# Patient Record
Sex: Female | Born: 2000
Health system: Southern US, Community
[De-identification: ages and names within clinical notes are randomized; demographics above are authoritative.]

## PROBLEM LIST (undated history)

## (undated) DIAGNOSIS — H539 Unspecified visual disturbance: Secondary | ICD-10-CM

## (undated) DIAGNOSIS — J45909 Unspecified asthma, uncomplicated: Secondary | ICD-10-CM

## (undated) DIAGNOSIS — F32A Depression, unspecified: Secondary | ICD-10-CM

## (undated) DIAGNOSIS — D649 Anemia, unspecified: Secondary | ICD-10-CM

## (undated) DIAGNOSIS — F909 Attention-deficit hyperactivity disorder, unspecified type: Secondary | ICD-10-CM

## (undated) DIAGNOSIS — T8859XA Other complications of anesthesia, initial encounter: Secondary | ICD-10-CM

## (undated) DIAGNOSIS — Z8489 Family history of other specified conditions: Secondary | ICD-10-CM

## (undated) DIAGNOSIS — T8069XA Other serum reaction due to other serum, initial encounter: Secondary | ICD-10-CM

## (undated) DIAGNOSIS — F419 Anxiety disorder, unspecified: Secondary | ICD-10-CM

## (undated) DIAGNOSIS — Z87442 Personal history of urinary calculi: Secondary | ICD-10-CM

## (undated) DIAGNOSIS — R011 Cardiac murmur, unspecified: Secondary | ICD-10-CM

## (undated) DIAGNOSIS — N200 Calculus of kidney: Secondary | ICD-10-CM

## (undated) DIAGNOSIS — R519 Headache, unspecified: Secondary | ICD-10-CM

## (undated) DIAGNOSIS — F1910 Other psychoactive substance abuse, uncomplicated: Secondary | ICD-10-CM

## (undated) DIAGNOSIS — K219 Gastro-esophageal reflux disease without esophagitis: Secondary | ICD-10-CM

## (undated) HISTORY — PX: ADENOIDECTOMY: SUR15

## (undated) HISTORY — PX: TONSILLECTOMY AND ADENOIDECTOMY: SUR1326

## (undated) HISTORY — DX: Other serum reaction due to other serum, initial encounter: T80.69XA

## (undated) HISTORY — PX: SHOULDER SURGERY: SHX246

## (undated) HISTORY — PX: SHOULDER ARTHROSCOPY: SHX128

## (undated) HISTORY — PX: KNEE ARTHROSCOPY W/ MENISCAL REPAIR: SHX1877

## (undated) HISTORY — PX: TYMPANOSTOMY TUBE PLACEMENT: SHX32

## (undated) HISTORY — PX: TYMPANOPLASTY: SHX33

## (undated) HISTORY — DX: Calculus of kidney: N20.0

---
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
## 2020-08-13 ENCOUNTER — Other Ambulatory Visit: Payer: Self-pay | Admitting: Urology

## 2020-08-13 ENCOUNTER — Other Ambulatory Visit (HOSPITAL_COMMUNITY)
Admission: RE | Admit: 2020-08-13 | Discharge: 2020-08-13 | Disposition: A | Discharge status: Home / Self Care | Payer: 59 | Source: Ambulatory Visit | Attending: Urology | Admitting: Urology

## 2020-08-13 DIAGNOSIS — Z20822 Contact with and (suspected) exposure to covid-19: Secondary | ICD-10-CM | POA: Diagnosis not present

## 2020-08-13 DIAGNOSIS — Z01812 Encounter for preprocedural laboratory examination: Secondary | ICD-10-CM | POA: Diagnosis present

## 2020-08-13 LAB — SARS CORONAVIRUS 2 (TAT 6-24 HRS): SARS Coronavirus 2: NEGATIVE

## 2020-08-13 NOTE — Progress Notes (Signed)
Patient to arrive at 0800 on 08/15/2020. History and medications reviewed. Pre-procedure instructions given. NPO after MN. Driver secured.

## 2020-08-14 NOTE — H&P (Addendum)
Patient with below noted history. She was seen for symptomatic episode in July. At that time she had acute onset flank pain and gross hematuria. Urine culture was negative for cystitis. KUB and renal ultrasound were reassuring. Two week follow-up was recommended, but she did not return. She presents today with complaints of acute onset right flank pain with associated gross hematuria, which began last night. Currently her pain is about a 4/out of 10, but she does have intermittent episodes of more severe pain. She did have some leftover hydrocodone from prior stone events, which she use early this morning with a more severe episode of pain. This was helpful. Last dosage of pain medication was at approximately 5:00 a.m.[name] She does complain of associated nausea. She states that she currently feels the urge to void, but is having a difficult time generating a stream and feels that she is not emptying efficiently. She denies any dysuria. No complaints of fevers or chills.   April 2021: Shelly Porter returns today in f/u. she reports resolution of the right flank pain. I reviewed records from Dr. Merry Lofty and she had Ca Ox and Ca Phos stone. She had a 24hr urine for metabolic evaluation and had protein but no other findings. Her UA has 3-10 RBC's that is chronic and 1+ protein. She has not seen a nephrologist. She is doing better with hydration.   GU Hx: Shelly Porter is an 19 yo WF who has a history of right renal stones and prior hydronephrosis. She presents with a 1 week history of right low back pain with radiation into the groin. She has had no visible hematuria. She had nausea with the pain. The pain is worse with standing and sitting up for long periods. Her last scan was in 2020 and there were 2 right upper pole renal stones. She has passed at least 3 prior stones and she had ESWL in 2018 with Dr. Merry Lofty in [place]. She has had a metobolic w/u. She has chronic proteinuria. She has had prior UTI's but none in the last few  years.   The problem is on the right side. Her pain started about approximately 01/23/2020.     ALLERGIES: Clindamycin - Hives Doxycycline - Anaphylaxis minocycline Penicillins - Hives sulfa - Hives    MEDICATIONS: None    GU PSH: ESWL        PSH Notes: Tube placement in ears-2002   Ear drum repair-2013    NON-GU PSH: Knee Arthroscopy/surgery Shoulder Arthroscopy/surgery           GU PMH: Flank Pain - 06/07/2020, Her pain has resolved and she thinks she might have passed a small stone. , - 03/13/2020, She has intermittent right low back pain with right renal stones that are stable since August 2020 and there is no hydro on [place] today so I can't pin the pain on the stones. I think her pain could be musculoskeletal and I will send a script for meloxicam 7.5mg  daily which she has tolerated before. There is a moderate interaction warning with the lexapro so I requested that she ask her psychiatrist if there needs to be a dose adjustment on the lexapro. I will have her return in 4-6 weeks. , - 01/30/2020 Gross hematuria - 06/07/2020 Renal calculus (Stable) - 06/07/2020 Microscopic hematuria, She has microhematuria with bacteriuria. The hematuria could be related to the stones or possibly infection. I will send a culture. - 01/30/2020 Proteinuria, She has 1+ proteinuria which is chronic. - 01/30/2020  NON-GU PMH: Anxiety Asthma GERD     FAMILY HISTORY: Hematuria - Father Kidney Stones - Runs in Family    SOCIAL HISTORY: Marital Status: Single Preferred Language: English; Ethnicity: Not Hispanic Or Latino; Race: White Current Smoking Status: Patient has never smoked.  <DIV'  Tobacco Use Assessment Completed:  Used Tobacco in last 30 days?   Does not use smokeless tobacco. Has never drank.  Drinks 3 caffeinated drinks per day. 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     REVIEW OF SYSTEMS:     GU Review Female:  Patient denies frequent urination, hard to postpone urination,  burning /pain with urination, get up at night to urinate, leakage of urine, stream starts and stops, trouble starting your stream, have to strain to urinate, and being pregnant.    Gastrointestinal (Upper):  Patient denies nausea, vomiting, and indigestion/ heartburn.    Gastrointestinal (Lower):  Patient denies diarrhea and constipation.    Constitutional:  Patient denies fever, night sweats, weight loss, and fatigue.    Skin:  Patient denies skin rash/ lesion and itching.    Eyes:  Patient denies blurred vision and double vision.    Ears/ Nose/ Throat:  Patient denies sore throat and sinus problems.    Hematologic/Lymphatic:  Patient denies swollen glands and easy bruising.    Cardiovascular:  Patient denies chest pains and leg swelling.    Respiratory:  Patient denies cough and shortness of breath.    Endocrine:  Patient denies excessive thirst.    Musculoskeletal:  Patient denies back pain and joint pain.    Neurological:  Patient denies headaches and dizziness.    Psychologic:  Patient denies depression and anxiety.    VITAL SIGNS:       08/12/2020 01:25 PM     Weight 110 lb / 49.9 kg     Height 62 in / 157.48 cm     BP 110/78 mmHg     Pulse 76 /min     BMI 20.1 kg/m     MULTI-SYSTEM PHYSICAL EXAMINATION:      Constitutional: Well-nourished. No physical deformities. Normally developed. Good grooming. Appears uncomfortable on exam.      Respiratory: Normal breath sounds. No labored breathing, no use of accessory muscles.      Cardiovascular: Regular rate and rhythm. No murmur, no gallop. Normal temperature, normal extremity pulses, no swelling, no varicosities.      Neurologic / Psychiatric: Oriented to time, oriented to place, oriented to person. No depression, no anxiety, no agitation.     Gastrointestinal: No mass, no tenderness, no rigidity, non obese abdomen. Right CVAT.      Musculoskeletal: Normal gait and station of head and neck.            Complexity of Data:   Records  Review:  Previous Patient Records  Urine Test Review:  Urinalysis, Urine Culture  X-Ray Review: KUB: Reviewed Films.  C.T. Abdomen/Pelvis: Reviewed Films. Reviewed Report.     PROCEDURES:    C.T. Urogram - [id number]      Patient confirmed No Neulasta OnPro Device.    PVR Ultrasound - 25956  Scanned Volume: 73 cc    Urinalysis w/Scope  Dipstick Dipstick Cont'd Micro  Color: Amber Bilirubin: Neg mg/dL WBC/hpf: 0 - 5/hpf  Appearance: Cloudy Ketones: Neg mg/dL RBC/hpf: >38/VFI  Specific Gravity: 1.020 Blood: 3+ ery/uL Bacteria: Mod (26-50/hpf)  pH: 7.0 Protein: 1+ mg/dL Cystals: Amorph Phosphates  Glucose: Neg mg/dL Urobilinogen: 0.2 mg/dL Casts: NS (Not Seen)  Nitrites: Neg Trichomonas: Not Present   Leukocyte Esterase: Neg leu/uL Mucous: Not Present    Epithelial Cells: 6 - 10/hpf    Yeast: NS (Not Seen)    Sperm: Not Present    Morphine 4mg  - J2270,  Qty: 2 Adm. By: [id number]  Unit: mg Lot No   Route: IM Exp. Date   Freq: None Mfgr.:   Site: None  Phenergan 25mg  - J2550, Andree Moro  The injection site was sterilely prepped with alcohol. Phenergan was injected (IM) using standard technique. The patient tolerated the procedure well. A band aid was applied. The site was dry when the patient left the exam room.   After treatment was administered, patient was observed to be symptom-free for 10-15 minutes.   Qty: 12.5 Adm. By:  Unit: mg Lot No [id number]  Route: IM Exp. Date 01/14/2022  Freq: None Mfgr.:   Site: None   Notes:  Per [id number], NP gave patient injections of 12.5 mg of Phomethazine lot # 03/16/2022 Exp: 01/2022 (left buttock) and Morphine 2 mg (right buttock)   ASSESSMENT:     ICD-10 Details  1 GU:  Gross hematuria - R31.0   2  Renal calculus - N20.0   3  Flank Pain - R10.84    PLAN:   Medications  New Meds: Hydrocodone-Acetaminophen 5 mg-325 mg tablet 1 tablet PO Q 6 H PRN For severe pain #20 0 Refill(s)  Ondansetron Odt 4 mg  tablet,disintegrating 1 tablet PO Q 8 H PRN #20 0 Refill(s)  Silodosin 4 mg capsule 1 capsule PO Daily #30 0 Refill(s)    Stop Meds: Ketorolac Tromethamine 10 mg tablet  Discontinue: 08/12/2020 - Reason: The medication cycle was completed.  Phenergan  Discontinue: 08/12/2020 - Reason: The medication cycle was completed.    Orders  Labs Urine Culture, Urine Preg.  X-Rays: C.T. Stone Protocol Without Contrast  Schedule  Procedure: 08/12/2020 at [facility] - [phone] - Morphine 4mg  (Ther/Proph/Diag Inj, [place]/Im) [facility], [id number] Notes: 2 mg  Procedure: Unspecified Date at [facility] - [id number] - Phenergan 25mg  (Phenergan Per 50 Mg) - J2550, [id number] Notes: 12.5 mg  Document  Letter(s):  Created for Patient: Clinical Summary   Notes:  Precautionary culture sent today. CT imaging reveals an approximately 5 x 6 mm right UPJ calculus with some mild hydronephrosis. She does have an additional nonobstructive calculi noted on the right. Official radiology impression is pending and I will keep her updated regarding final report. We reviewed treatment options today in detail and she would like to consider moving forward with intervention. She is most interested in proceeding with ESWL, as this is been successful for her in the past. We again the procedure and potential risk. She voiced understanding on like to proceed. She was given injections of Phenergan and morphine today in clinic and noted improvement in symptoms of following injection therapies. Above-noted prescriptions were sent to her pharmacy for symptom management in the interim. We reviewed indications, usage, and potential side effects of these in detail. [facility] 08-10-2002 controlled substance database was reviewed. Ultimately, I will review her imaging studies from today with her urologist for recommendations moving forward. If he is in agreement, we will work on getting her scheduled for ESWL next available.  Strict return precautions reviewed in the interim for fevers or refractory pain, nausea, or vomiting. She voiced understanding.   After a thorough review of the management options for the patient's condition the patient  elected to proceed with surgical therapy as noted above. We have discussed the potential benefits and risks of the procedure, side effects of the proposed treatment, the likelihood of the patient achieving the goals of the procedure, and any potential problems that might occur during the procedure or recuperation. Informed consent has been obtained.

## 2020-08-15 ENCOUNTER — Encounter (HOSPITAL_BASED_OUTPATIENT_CLINIC_OR_DEPARTMENT_OTHER): Payer: Self-pay | Admitting: Urology

## 2020-08-15 ENCOUNTER — Encounter (HOSPITAL_BASED_OUTPATIENT_CLINIC_OR_DEPARTMENT_OTHER)
Admission: RE | Disposition: A | Discharge status: Home / Self Care | Payer: Self-pay | Source: Home / Self Care | Attending: Urology

## 2020-08-15 ENCOUNTER — Ambulatory Visit (HOSPITAL_COMMUNITY): Payer: 59

## 2020-08-15 ENCOUNTER — Other Ambulatory Visit: Payer: Self-pay

## 2020-08-15 ENCOUNTER — Ambulatory Visit (HOSPITAL_BASED_OUTPATIENT_CLINIC_OR_DEPARTMENT_OTHER)
Admission: RE | Admit: 2020-08-15 | Discharge: 2020-08-15 | Disposition: A | Discharge status: Home / Self Care | Payer: 59 | Attending: Urology | Admitting: Urology

## 2020-08-15 DIAGNOSIS — Z880 Allergy status to penicillin: Secondary | ICD-10-CM | POA: Diagnosis not present

## 2020-08-15 DIAGNOSIS — F909 Attention-deficit hyperactivity disorder, unspecified type: Secondary | ICD-10-CM | POA: Diagnosis not present

## 2020-08-15 DIAGNOSIS — Z841 Family history of disorders of kidney and ureter: Secondary | ICD-10-CM | POA: Diagnosis not present

## 2020-08-15 DIAGNOSIS — F429 Obsessive-compulsive disorder, unspecified: Secondary | ICD-10-CM | POA: Insufficient documentation

## 2020-08-15 DIAGNOSIS — J45909 Unspecified asthma, uncomplicated: Secondary | ICD-10-CM | POA: Diagnosis not present

## 2020-08-15 DIAGNOSIS — Z79899 Other long term (current) drug therapy: Secondary | ICD-10-CM | POA: Insufficient documentation

## 2020-08-15 DIAGNOSIS — F329 Major depressive disorder, single episode, unspecified: Secondary | ICD-10-CM | POA: Insufficient documentation

## 2020-08-15 DIAGNOSIS — K219 Gastro-esophageal reflux disease without esophagitis: Secondary | ICD-10-CM | POA: Insufficient documentation

## 2020-08-15 DIAGNOSIS — Z882 Allergy status to sulfonamides status: Secondary | ICD-10-CM | POA: Insufficient documentation

## 2020-08-15 DIAGNOSIS — Z881 Allergy status to other antibiotic agents status: Secondary | ICD-10-CM | POA: Diagnosis not present

## 2020-08-15 DIAGNOSIS — R310 Gross hematuria: Secondary | ICD-10-CM | POA: Insufficient documentation

## 2020-08-15 DIAGNOSIS — N200 Calculus of kidney: Secondary | ICD-10-CM | POA: Diagnosis present

## 2020-08-15 DIAGNOSIS — Z888 Allergy status to other drugs, medicaments and biological substances status: Secondary | ICD-10-CM | POA: Insufficient documentation

## 2020-08-15 DIAGNOSIS — F419 Anxiety disorder, unspecified: Secondary | ICD-10-CM | POA: Insufficient documentation

## 2020-08-15 DIAGNOSIS — F4310 Post-traumatic stress disorder, unspecified: Secondary | ICD-10-CM | POA: Diagnosis not present

## 2020-08-15 HISTORY — DX: Other complications of anesthesia, initial encounter: T88.59XA

## 2020-08-15 HISTORY — DX: Family history of other specified conditions: Z84.89

## 2020-08-15 HISTORY — PX: EXTRACORPOREAL SHOCK WAVE LITHOTRIPSY: SHX1557

## 2020-08-15 LAB — POCT PREGNANCY, URINE: Preg Test, Ur: NEGATIVE

## 2020-08-15 SURGERY — LITHOTRIPSY, ESWL
Anesthesia: LOCAL | Laterality: Right

## 2020-08-15 MED ORDER — CIPROFLOXACIN HCL 500 MG PO TABS
ORAL_TABLET | ORAL | Status: AC
  Filled 2020-08-15: qty 1

## 2020-08-15 MED ORDER — CIPROFLOXACIN HCL 500 MG PO TABS
500.0000 mg | ORAL_TABLET | ORAL | Status: AC
  Administered 2020-08-15: 500 mg via ORAL

## 2020-08-15 MED ORDER — HYDROCODONE-ACETAMINOPHEN 5-325 MG PO TABS: 1.0000 | ORAL_TABLET | Freq: Four times a day (QID) | ORAL | 0 refills | Status: AC | PRN

## 2020-08-15 MED ORDER — DIAZEPAM 5 MG PO TABS
10.0000 mg | ORAL_TABLET | ORAL | Status: AC
  Administered 2020-08-15: 10 mg via ORAL

## 2020-08-15 MED ORDER — DIPHENHYDRAMINE HCL 25 MG PO CAPS
25.0000 mg | ORAL_CAPSULE | ORAL | Status: AC
  Administered 2020-08-15: 25 mg via ORAL

## 2020-08-15 MED ORDER — DIPHENHYDRAMINE HCL 25 MG PO CAPS
ORAL_CAPSULE | ORAL | Status: AC
  Filled 2020-08-15: qty 1

## 2020-08-15 MED ORDER — DIAZEPAM 5 MG PO TABS
ORAL_TABLET | ORAL | Status: AC
  Filled 2020-08-15: qty 2

## 2020-08-15 MED ORDER — SODIUM CHLORIDE 0.9 % IV SOLN: INTRAVENOUS | Status: DC

## 2020-08-15 NOTE — Interval H&P Note (Signed)
History and Physical Interval Note:  08/15/2020 8:47 AM  Shelly Porter  has presented today for surgery, with the diagnosis of RIGHT URETERAL PELVIC JUNCTION STONE.  The various methods of treatment have been discussed with the patient and family. After consideration of risks, benefits and other options for treatment, the patient has consented to  Procedure(s): RIGHT EXTRACORPOREAL SHOCK WAVE LITHOTRIPSY (ESWL) (Right) as a surgical intervention.  The patient's history has been reviewed, patient examined, no change in status, stable for surgery.  I have reviewed the patient's chart and labs.  Questions were answered to the patient's satisfaction.     [name]

## 2020-08-15 NOTE — Discharge Instructions (Signed)

## 2020-08-16 ENCOUNTER — Encounter (HOSPITAL_COMMUNITY): Payer: Self-pay | Admitting: Emergency Medicine

## 2020-08-16 ENCOUNTER — Emergency Department (HOSPITAL_COMMUNITY): Payer: 59

## 2020-08-16 ENCOUNTER — Emergency Department (HOSPITAL_COMMUNITY)
Admission: EM | Admit: 2020-08-16 | Discharge: 2020-08-16 | Disposition: A | Discharge status: Home / Self Care | Payer: 59 | Attending: Emergency Medicine | Admitting: Emergency Medicine

## 2020-08-16 DIAGNOSIS — N132 Hydronephrosis with renal and ureteral calculous obstruction: Secondary | ICD-10-CM | POA: Insufficient documentation

## 2020-08-16 DIAGNOSIS — J45909 Unspecified asthma, uncomplicated: Secondary | ICD-10-CM | POA: Insufficient documentation

## 2020-08-16 DIAGNOSIS — Z79899 Other long term (current) drug therapy: Secondary | ICD-10-CM | POA: Insufficient documentation

## 2020-08-16 DIAGNOSIS — R109 Unspecified abdominal pain: Secondary | ICD-10-CM

## 2020-08-16 DIAGNOSIS — R8290 Unspecified abnormal findings in urine: Secondary | ICD-10-CM

## 2020-08-16 DIAGNOSIS — Z9889 Other specified postprocedural states: Secondary | ICD-10-CM

## 2020-08-16 LAB — BASIC METABOLIC PANEL
Anion gap: 9 (ref 5–15)
BUN: 10 mg/dL (ref 6–20)
CO2: 24 mmol/L (ref 22–32)
Calcium: 8.7 mg/dL — ABNORMAL LOW (ref 8.9–10.3)
Chloride: 106 mmol/L (ref 98–111)
Creatinine, Ser: 0.81 mg/dL (ref 0.44–1.00)
GFR calc Af Amer: >60 mL/min (ref >60)
GFR calc non Af Amer: >60 mL/min (ref >60)
Glucose, Bld: 114 mg/dL — ABNORMAL HIGH (ref 70–99)
Potassium: 4 mmol/L (ref 3.5–5.1)
Sodium: 139 mmol/L (ref 135–145)

## 2020-08-16 LAB — URINALYSIS, ROUTINE W REFLEX MICROSCOPIC
Bilirubin Urine: NEGATIVE
Glucose, UA: NEGATIVE mg/dL
Ketones, ur: 5 mg/dL — AB
Leukocytes,Ua: NEGATIVE
Nitrite: NEGATIVE
Protein, ur: NEGATIVE mg/dL
RBC / HPF: >50 RBC/hpf — ABNORMAL HIGH (ref 0–5)
Specific Gravity, Urine: 1.016 (ref 1.005–1.030)
pH: 6 (ref 5.0–8.0)

## 2020-08-16 LAB — CBC
HCT: 37 % (ref 36.0–46.0)
Hemoglobin: 12.2 g/dL (ref 12.0–15.0)
MCH: 29.8 pg (ref 26.0–34.0)
MCHC: 33 g/dL (ref 30.0–36.0)
MCV: 90.2 fL (ref 80.0–100.0)
Platelets: 204 10*3/uL (ref 150–400)
RBC: 4.1 MIL/uL (ref 3.87–5.11)
RDW: 11.9 % (ref 11.5–15.5)
WBC: 9.8 10*3/uL (ref 4.0–10.5)
nRBC: 0 % (ref 0.0–0.2)

## 2020-08-16 LAB — I-STAT BETA HCG BLOOD, ED (MC, WL, AP ONLY): I-stat hCG, quantitative: <5 m[IU]/mL (ref <5)

## 2020-08-16 MED ORDER — ONDANSETRON HCL 4 MG/2ML IJ SOLN
4.0000 mg | Freq: Once | INTRAMUSCULAR | Status: AC
  Administered 2020-08-16: 4 mg via INTRAVENOUS
  Filled 2020-08-16: qty 2

## 2020-08-16 MED ORDER — KETOROLAC TROMETHAMINE 10 MG PO TABS: 10.0000 mg | ORAL_TABLET | Freq: Three times a day (TID) | ORAL | 0 refills | Status: AC | PRN

## 2020-08-16 MED ORDER — SODIUM CHLORIDE 0.9 % IV BOLUS
1000.0000 mL | Freq: Once | INTRAVENOUS | Status: AC
  Administered 2020-08-16: 1000 mL via INTRAVENOUS

## 2020-08-16 MED ORDER — TAMSULOSIN HCL 0.4 MG PO CAPS
0.4000 mg | ORAL_CAPSULE | Freq: Once | ORAL | Status: AC
  Administered 2020-08-16: 0.4 mg via ORAL
  Filled 2020-08-16: qty 1

## 2020-08-16 MED ORDER — SODIUM CHLORIDE 0.9 % IV BOLUS
500.0000 mL | Freq: Once | INTRAVENOUS | Status: AC
  Administered 2020-08-16: 500 mL via INTRAVENOUS

## 2020-08-16 MED ORDER — KETOROLAC TROMETHAMINE 30 MG/ML IJ SOLN
15.0000 mg | Freq: Once | INTRAMUSCULAR | Status: AC
  Administered 2020-08-16: 15 mg via INTRAVENOUS
  Filled 2020-08-16: qty 1

## 2020-08-16 MED ORDER — ACETAMINOPHEN 500 MG PO TABS
1000.0000 mg | ORAL_TABLET | Freq: Once | ORAL | Status: AC
  Administered 2020-08-16: 1000 mg via ORAL
  Filled 2020-08-16: qty 2

## 2020-08-16 MED ORDER — ONDANSETRON 4 MG PO TBDP: 4.0000 mg | ORAL_TABLET | Freq: Three times a day (TID) | ORAL | 0 refills | Status: AC | PRN

## 2020-08-16 MED ORDER — CEPHALEXIN 500 MG PO CAPS: 500.0000 mg | ORAL_CAPSULE | Freq: Four times a day (QID) | ORAL | 0 refills | Status: DC

## 2020-08-16 MED ORDER — MORPHINE SULFATE (PF) 4 MG/ML IV SOLN
4.0000 mg | Freq: Once | INTRAVENOUS | Status: AC
  Administered 2020-08-16: 4 mg via INTRAVENOUS
  Filled 2020-08-16: qty 1

## 2020-08-16 MED ORDER — HYDROMORPHONE HCL 1 MG/ML IJ SOLN
0.5000 mg | Freq: Once | INTRAMUSCULAR | Status: AC
  Administered 2020-08-16: 0.5 mg via INTRAVENOUS
  Filled 2020-08-16: qty 1

## 2020-08-16 NOTE — ED Provider Notes (Addendum)
[facility]-EMERGENCY DEPT Provider Note   CSN: [id number] Arrival date & time: 08/16/20  6578     History Chief Complaint  Patient presents with  . Flank Pain    Shelly Porter is a 19 y.o. female with history of recurrent kidney stones status post lithotripsy yesterday presents to the ER for evaluation of sudden onset right flank pain that began at 3 AM this morning.  It is constant, severe.  It radiates to the right mid abdomen.  Has associated nausea and vomiting.  Patient states she is shaking but she attributes this to the pain.  States she felt okay after the procedure and when she went to bed but the pain suddenly woke her up in the middle of the night.  She called her urologist Dr. McDiarmid office and was told to come to the ER.  She was prescribed hydrocodone after the procedure and she has taken 2 for the pain but the pain is persistent and refractory to it.  Denies any fever.  Her urine is bloody and she has noticed fragments of stones in the urine as well.  Patient states this is typical for her and happened the last 2 time she had lithotripsy in the past.  States in the past she has never had pain like this after lithotripsy.  Denies any bowel movement changes.  HPI     Past Medical History:  Diagnosis Date  . ADHD (attention deficit hyperactivity disorder)   . Anxiety   . Asthma   . Complication of anesthesia    emotional wiith Versed and " my body temp drops."   . Family history of adverse reaction to anesthesia    "my mom has a low body temp with anesthesia and she gets nauseous too."   . Headache   . Kidney stones    Lithotrypsy  . Serum sickness due to drug    Reaction with Doxycycline  . Vision abnormalities    Pt wears contacts    Patient Active Problem List   Diagnosis Date Noted  . MDD (major depressive disorder) 07/31/2019  . Major depressive disorder, recurrent episode, moderate (HCC) 04/27/2019  . MDD (major depressive  disorder), recurrent severe, without psychosis (HCC) 04/04/2019  . ADHD, hyperactive-impulsive type 04/04/2019  . GAD (generalized anxiety disorder) 04/04/2019  . Chronic post-traumatic stress disorder (PTSD) 04/04/2019    Past Surgical History:  Procedure Laterality Date  . ADENOIDECTOMY    . EXTRACORPOREAL SHOCK WAVE LITHOTRIPSY Right 08/15/2020   Procedure: RIGHT EXTRACORPOREAL SHOCK WAVE LITHOTRIPSY (ESWL);  Surgeon: Alfredo Martinez, MD;  Location: [facility];  Service: Urology;  Laterality: Right;  . KNEE ARTHROSCOPY W/ MENISCAL REPAIR    . SHOULDER ARTHROSCOPY     Nov 2019  . TONSILLECTOMY AND ADENOIDECTOMY    . TYMPANOPLASTY    . TYMPANOSTOMY TUBE PLACEMENT       OB History   No obstetric history on file.     Family History  Problem Relation Age of Onset  . Schizophrenia Paternal Uncle     Social History   Tobacco Use  . Smoking status: Never Smoker  . Smokeless tobacco: Never Used  Vaping Use  . Vaping Use: Never used  Substance Use Topics  . Alcohol use: No    Alcohol/week: 0.0 standard drinks  . Drug use: No    Home Medications Prior to Admission medications   Medication Sig Start Date End Date Taking? Authorizing Provider  albuterol (PROAIR HFA) 108 (90  Base) MCG/ACT inhaler Inhale 2 puffs into the lungs every 6 (six) hours as needed for wheezing or shortness of breath. Patient not taking: Reported on 04/03/2019 05/25/16   Jessica Priest, MD  busPIRone (BUSPAR) 15 MG tablet TAKE 1 TABLET BY MOUTH EVERYDAY AT BEDTIME 05/31/19   Gentry Fitz, MD  cephALEXin (KEFLEX) 500 MG capsule Take 1 capsule (500 mg total) by mouth 4 (four) times daily. 08/16/20   Liberty Handy, PA-C  escitalopram (LEXAPRO) 10 MG tablet Take 1 1/2 tabs each day 07/06/19   Gentry Fitz, MD  HYDROcodone-acetaminophen (NORCO/VICODIN) 5-325 MG tablet Take 1 tablet by mouth every 6 (six) hours as needed for moderate pain.    [provider]    HYDROcodone-acetaminophen (NORCO/VICODIN) 5-325 MG tablet Take 1-2 tablets by mouth every 6 (six) hours as needed for moderate pain. 08/15/20 08/15/21  Alfredo Martinez, MD  ketorolac (TORADOL) 10 MG tablet Take 1 tablet (10 mg total) by mouth every 8 (eight) hours as needed for up to 3 days for moderate pain. 08/16/20 08/19/20  Liberty Handy, PA-C  ondansetron (ZOFRAN ODT) 4 MG disintegrating tablet Take 1 tablet (4 mg total) by mouth every 8 (eight) hours as needed for nausea or vomiting. 08/16/20   Liberty Handy, PA-C  ondansetron (ZOFRAN) 4 MG tablet Take 4 mg by mouth every 8 (eight) hours as needed for nausea or vomiting.    [provider]  pantoprazole (PROTONIX) 40 MG tablet Take one tablet by mouth every evening as directed. Patient not taking: Reported on 04/03/2019 04/25/18   Jessica Priest, MD  prazosin (MINIPRESS) 1 MG capsule Take 1 mg by mouth at bedtime. 07/19/19   [provider]  silodosin (RAPAFLO) 4 MG CAPS capsule Take 4 mg by mouth once. At bedtime    [provider]  Burr Medico 150-35 MCG/24HR transdermal patch Place 1 patch onto the skin every Sunday. For 3 weeks of the month 04/10/17   [provider]    Allergies    Doxycycline, Shellfish allergy, Clindamycin/lincomycin, Penicillins, and Sulfa antibiotics  Review of Systems   Review of Systems  Gastrointestinal: Positive for abdominal pain, nausea and vomiting.  Genitourinary: Positive for flank pain and hematuria.  All other systems reviewed and are negative.   Physical Exam Updated Vital Signs BP 95/71   Pulse 90   Temp 98.3 F (36.8 C) (Oral)   Resp 18   SpO2 99%   Physical Exam Vitals and nursing note reviewed.  Constitutional:      General: She is in acute distress.     Appearance: She is well-developed.     Comments: Non toxic but patient appears very uncomfortable, crying   HENT:     Head: Normocephalic and atraumatic.     Nose: Nose normal.  Eyes:      Conjunctiva/sclera: Conjunctivae normal.  Cardiovascular:     Rate and Rhythm: Normal rate and regular rhythm.  Pulmonary:     Effort: Pulmonary effort is normal.     Breath sounds: Normal breath sounds.  Abdominal:     General: Bowel sounds are normal.     Palpations: Abdomen is soft.     Tenderness: There is abdominal tenderness. There is right CVA tenderness.     Comments: Patient with severe pain/crying with movement in bed. Right CVA tenderness, right mid abdominal tenderness, suprapubic tenderness with guarding. Active BS to lower quadrants. No left CVAT.   Musculoskeletal:  General: Normal range of motion.     Cervical back: Normal range of motion.  Skin:    General: Skin is warm and dry.     Capillary Refill: Capillary refill takes less than 2 seconds.  Neurological:     Mental Status: She is alert.  Psychiatric:        Behavior: Behavior normal.     ED Results / Procedures / Treatments   Labs (all labs ordered are listed, but only abnormal results are displayed) Labs Reviewed  URINALYSIS, ROUTINE W REFLEX MICROSCOPIC - Abnormal; Notable for the following components:      Result Value   APPearance HAZY (*)    Hgb urine dipstick LARGE (*)    Ketones, ur 5 (*)    RBC / HPF >50 (*)    Bacteria, UA MANY (*)    All other components within normal limits  BASIC METABOLIC PANEL - Abnormal; Notable for the following components:   Glucose, Bld 114 (*)    Calcium 8.7 (*)    All other components within normal limits  URINE CULTURE  CBC  I-STAT BETA HCG BLOOD, ED ([facility], [facility], [facility] ONLY)    EKG None  Radiology DG Abd 1 View  Result Date: 08/15/2020 CLINICAL DATA:  Preop for lithotripsy.  Right-sided kidney stone. EXAM: ABDOMEN - 1 VIEW COMPARISON:  CT 08/12/2020 FINDINGS: Two stones project over the right kidney, 7 mm stone projecting over the lower right renal pelvis, 4 mm stone in the central renal pelvis region, unchanged in position from recent CT. No visualized left  urolithiasis. No calcifications along the course of the ureters or in the pelvis. Moderate volume of stool. No bowel obstruction. No evidence of free air. No osseous abnormalities. IMPRESSION: Right nephrolithiasis, unchanged in position from recent CT. Electronically Signed   By: Narda RutherfordMelanie  Sanford M.D.   On: 08/15/2020 23:50   CT Renal Stone Study  Result Date: 08/16/2020 CLINICAL DATA:  Right flank pain.  Lithotripsy 1 day ago EXAM: CT ABDOMEN AND PELVIS WITHOUT CONTRAST TECHNIQUE: Multidetector CT imaging of the abdomen and pelvis was performed following the standard protocol without IV contrast. COMPARISON:  CT 08/12/2020 FINDINGS: Lower chest: Visualized lung bases are clear. Hepatobiliary: The hepatic dome was not entirely included within the field of view. No focal liver lesion identified. Unremarkable gallbladder. No hyperdense gallstone. No biliary dilatation. Pancreas: Unremarkable. Spleen: Unremarkable. Adrenals/Urinary Tract: Fragmentation of the previously seen right renal calculi with two punctate 2 mm stone fragments dependently within the right renal pelvis (series 2, image 16) and a few additional 1-2 mm stone fragments within the mid and inferior pole of the right kidney. There are multiple 3-4 mm stone fragments within the distal right ureter at the level of the pelvic sidewall (series 4, image 68). At the right ureterovesical junction is a stone or conglomerate of small stones measuring up to 7 mm (series 4, image 59; series 2, image 58). There is mild right hydroureteronephrosis. No perinephric hematoma or fluid collection. Left kidney and ureter are unremarkable without stone or hydronephrosis. 3 mm punctate stone is present dependently within the bladder lumen. Urinary bladder otherwise unremarkable. Adrenal glands unremarkable. Stomach/Bowel: The stomach, small bowel, colon, and appendix are unremarkable. Vascular/Lymphatic: No significant vascular findings are present. No enlarged  abdominal or pelvic lymph nodes. Reproductive: Uterus and bilateral adnexa are unremarkable. Other: No free fluid. No abdominopelvic fluid collection. No pneumoperitoneum. No abdominal wall hernia. Musculoskeletal: No acute or significant osseous findings. Limbus vertebrae again noted at  L4. IMPRESSION: 1. Fragmentation of the previously seen right renal calculi with multiple punctate 2 mm stone fragments dependently within the right kidney and right renal pelvis. Multiple 3-4 mm stone fragments within the distal right ureter at the level of the pelvic sidewall. At the right ureterovesical junction is a stone or conglomerate of small stones in total measuring up to 7 mm. There is mild right hydroureteronephrosis. 2. No perinephric hematoma. 3. 3 mm punctate stone within the bladder lumen. Electronically Signed   By: Duanne Guess D.O.   On: 08/16/2020 08:35    Procedures Procedures (including critical care time)  Medications Ordered in ED Medications  ketorolac (TORADOL) 30 MG/ML injection 15 mg (15 mg Intravenous Given 08/16/20 0718)  ondansetron (ZOFRAN) injection 4 mg (4 mg Intravenous Given 08/16/20 0718)  HYDROmorphone (DILAUDID) injection 0.5 mg (0.5 mg Intravenous Given 08/16/20 0719)  sodium chloride 0.9 % bolus 500 mL (0 mLs Intravenous Stopped 08/16/20 1008)  tamsulosin (FLOMAX) capsule 0.4 mg (0.4 mg Oral Given 08/16/20 0800)  sodium chloride 0.9 % bolus 1,000 mL (1,000 mLs Intravenous New Bag/Given (Non-Interop) 08/16/20 0939)  morphine 4 MG/ML injection 4 mg (4 mg Intravenous Given 08/16/20 0940)  acetaminophen (TYLENOL) tablet 1,000 mg (1,000 mg Oral Given 08/16/20 0940)    ED Course  I have reviewed the triage vital signs and the nursing notes.  Pertinent labs & imaging results that were available during my care of the patient were reviewed by me and considered in my medical decision making (see chart for details).  Clinical Course as of Aug 16 1009  Fri Aug 16, 2020  0732 Spoke to  urology Merry Proud recommends CT renal to eval for hematoma, continue pain management, page back after scan    [CG]  0819 Hgb urine dipstick(!): LARGE [CG]  0819 RBC / HPF(!): >50 [CG]  0819 WBC, UA: 11-20 [CG]  0819 Bacteria, UA(!): MANY [CG]  0819 Squamous Epithelial / LPF: 0-5 [CG]  0819 Re-evaluated patient, much more comfortable states she feels better. CT renal pending    [CG]  0841 IMPRESSION: 1. Fragmentation of the previously seen right renal calculi with multiple punctate 2 mm stone fragments dependently within the right kidney and right renal pelvis. Multiple 3-4 mm stone fragments within the distal right ureter at the level of the pelvic sidewall. At the right ureterovesical junction is a stone or conglomerate of small stones in total measuring up to 7 mm. There is mild right hydroureteronephrosis. 2. No perinephric hematoma. 3. 3 mm punctate stone within the bladder lumen.  CT Renal [place] [CG]  2505 Reevaluated patient.  States the pain has improved significantly.  Does state that the pain is coming back slightly and asking for more pain medicine.  Discussed ER work-up including urinalysis and CT.  Will repeat pain medicines, reassess.  Urology has been paged again.   [CG]  0940 Spoke to urology Merry Proud again they have reviewed CT and UA, recommend discharge home with antibiotic, they will call for close follow up    [CG]    Clinical Course User Index [CG] Liberty Handy, PA-C   MDM Rules/Calculators/A&P                          19 year old female presents for right flank pain that radiates to the right mid abdomen, nausea and vomiting.  Appears to be really uncomfortable on arrival, vomiting. Status post lithotripsy 24 hours ago.  Patient's EMR, triage  nursing notes reviewed to assist with MDM and obtain more history.  History of kidney stones since age 33, has had several previous lithotripsies. Last yesterday by Dr MadDiarmid. History of Ca ox and Ca ph  stones.  ER work-up including lab work and urinalysis started by triage RN.  This has been personally reviewed and interpreted.    No leukocytosis.  Creatinine is normal.  Negative pregnancy test.  Urinalysis shows large hemoglobin, more than 50 RBCs, 11-20 WBC, many bacteria, no squamous epithelium.  Ddx includes UTI, retroperitoneal hematoma, passing stones fragments, ureteral colic.   I have ordered CT renal, urine culture to be sent.  I have ordered 0.5 mg Dilaudid, Toradol, Zofran, IV fluids.  We will plan to reassess closely.  1000: CT renal personally visualized and interpreted, as above.  There are several stone fragments that she is passing including multiple 3 to 4 mm stone fragments in the distal right ureter and either one large stone or a conglomerate of small stones measuring up to 7 mm in the right ureterovesical junction with mild right hydroureternephrosis, 3 mm punctate stone in the bladder lumen.  Patient has been reevaluated several times.  She has had significant improvement in pain.  She passed 2 kidney stones while in the ER, this was put in a container and given to patient to be taken to urologist.  She has had stone testing and has history of calcium oxalate and calcium phosphate stones.  Discussed with urology who recommends discharge with antibiotics, increased hydrocodone, Flomax, oral hydration.  They will contact patient for close follow-up.  Plan of care discussed with patient and mother who are in agreement.  Return precautions discussed.  Final Clinical Impression(s) / ED Diagnoses Final diagnoses:  Flank pain  History of lithotripsy  Abnormal urinalysis    Rx / DC Orders ED Discharge Orders         Ordered    ondansetron (ZOFRAN ODT) 4 MG disintegrating tablet  Every 8 hours PRN        08/16/20 1004    cephALEXin (KEFLEX) 500 MG capsule  4 times daily        08/16/20 1004    ketorolac (TORADOL) 10 MG tablet  Every 8 hours PRN        08/16/20 1006               Liberty Handy, [place] 08/16/20 1010    Tilden Fossa, MD 08/16/20 1310

## 2020-08-16 NOTE — ED Notes (Signed)
Pt will be d/c after bolus.

## 2020-08-16 NOTE — Discharge Instructions (Addendum)
You were seen in the ER for right flank pain  We obtain lab work, urinalysis and CT.  Your urine has a lot of white blood cells and bacteria.    I spoke to [facility] urology.  They have reviewed your lab work, urinalysis HCT.  They recommend discharge home with increased pain control, nausea medicine, oral hydration and antibiotics.  Please increase your hydrocodone and take 2 of these at a time.  I have prescribed you oral Toradol that you can take every 8 hours, this medication can be irritating to the stomach so please take with food.  I recommend 10 mg toradol every 8 hours, and 2 hydrocodone every 6 hours over the next 24 hours for maximum pain control. Then can start backing down on both hydrocodone and toradol as tolerated. I have refilled ondansetron/Zofran to help with nausea.  Continue flomax daily.   Urology will call you to arrange close follow-up.  Return to the ER for worsening or constant pain despite the above treatment, persistent vomiting, fever, inability urinate

## 2020-08-16 NOTE — ED Triage Notes (Addendum)
Patient here from home reporting right flank pain radiating around to abd down into groin. Reports lithotripsy on yesterday. Pain 10/10. 2 hydrocodone tablets with no relief.

## 2020-08-16 NOTE — ED Notes (Signed)
Pt ambulatory to restroom with 1 assist due to pain.

## 2020-08-17 LAB — URINE CULTURE: Culture: NO GROWTH

## 2020-08-19 ENCOUNTER — Encounter (HOSPITAL_BASED_OUTPATIENT_CLINIC_OR_DEPARTMENT_OTHER): Payer: Self-pay | Admitting: Urology

## 2020-08-19 ENCOUNTER — Other Ambulatory Visit: Payer: Self-pay

## 2020-08-19 ENCOUNTER — Other Ambulatory Visit: Payer: Self-pay | Admitting: Urology

## 2020-08-19 NOTE — Progress Notes (Signed)
Spoke w/ via phone for pre-op interview---pt Lab needs dos---urine poct-               Lab results------none COVID test ------08-20-20 at 1500 Arrive at -------900 am 08-22-20 NPO after MN NO Solid Food.  Clear liquids from MN until--- Medications to take morning of surgery -----hydrocodone prn Diabetic medication -----n/a Patient Special Instructions -----none Pre-Op special Istructions -----none Patient verbalized understanding of instructions that were given at this phone interview. Patient denies shortness of breath, chest pain, fever, cough at this phone interview.

## 2020-08-20 ENCOUNTER — Other Ambulatory Visit (HOSPITAL_COMMUNITY)
Admission: RE | Admit: 2020-08-20 | Discharge: 2020-08-20 | Disposition: A | Discharge status: Home / Self Care | Payer: 59 | Source: Ambulatory Visit | Attending: Urology | Admitting: Urology

## 2020-08-20 DIAGNOSIS — Z20822 Contact with and (suspected) exposure to covid-19: Secondary | ICD-10-CM | POA: Insufficient documentation

## 2020-08-20 DIAGNOSIS — Z01812 Encounter for preprocedural laboratory examination: Secondary | ICD-10-CM | POA: Insufficient documentation

## 2020-08-20 LAB — SARS CORONAVIRUS 2 (TAT 6-24 HRS): SARS Coronavirus 2: NEGATIVE

## 2020-08-22 ENCOUNTER — Ambulatory Visit (HOSPITAL_BASED_OUTPATIENT_CLINIC_OR_DEPARTMENT_OTHER): Admission: RE | Admit: 2020-08-22 | Payer: 59 | Source: Home / Self Care | Admitting: Urology

## 2020-08-22 HISTORY — DX: Personal history of urinary calculi: Z87.442

## 2020-08-22 SURGERY — CYSTOSCOPY/URETEROSCOPY/HOLMIUM LASER/STENT PLACEMENT
Anesthesia: General | Laterality: Right

## 2020-12-18 ENCOUNTER — Other Ambulatory Visit: Payer: Self-pay

## 2020-12-18 ENCOUNTER — Ambulatory Visit: Payer: 59 | Admitting: Rehabilitative and Restorative Service Providers"

## 2020-12-30 ENCOUNTER — Encounter: Payer: Self-pay | Admitting: Physical Therapy

## 2020-12-30 ENCOUNTER — Ambulatory Visit: Payer: 59 | Admitting: Physical Therapy

## 2020-12-30 ENCOUNTER — Other Ambulatory Visit: Payer: Self-pay

## 2020-12-30 DIAGNOSIS — M5413 Radiculopathy, cervicothoracic region: Secondary | ICD-10-CM

## 2020-12-30 DIAGNOSIS — M6281 Muscle weakness (generalized): Secondary | ICD-10-CM | POA: Diagnosis not present

## 2020-12-30 DIAGNOSIS — R29898 Other symptoms and signs involving the musculoskeletal system: Secondary | ICD-10-CM | POA: Diagnosis not present

## 2020-12-30 NOTE — Therapy (Signed)
[facility] [address] [place], [place], [id number] Phone: [phone]   Fax:  [phone]  Physical Therapy Evaluation  [name] MRN: [id number] Date of Birth: 07/02/2001 Referring Provider (Shelly Porter): Julien GirtKirstin Shepperson, [place]   Encounter Date: 12/30/2020   Shelly Porter End of Session - 12/30/20 1341    Visit Number 1    Number of Visits 12    Date for Shelly Porter Re-Evaluation 02/10/21    Shelly Porter Start Time 1300    Shelly Porter Stop Time 1340    Shelly Porter Time Calculation (min) 40 min    Activity Tolerance Patient tolerated treatment well    Behavior [facility] for tasks assessed/performed           Past Medical History:  Diagnosis Date  . ADHD (attention deficit hyperactivity disorder)   . Anxiety   . Asthma   . Complication of anesthesia    emotional wiith Versed and " my body temp drops."   . Family history of adverse reaction to anesthesia    "my mom has a low body temp with anesthesia and she gets nauseous too."   . Headache    cluster and migraine  . History of kidney stones   . Serum sickness due to drug    Reaction with Doxycycline    Past Surgical History:  Procedure Laterality Date  . ADENOIDECTOMY    . EXTRACORPOREAL SHOCK WAVE LITHOTRIPSY Right 08/15/2020   Procedure: RIGHT EXTRACORPOREAL SHOCK WAVE LITHOTRIPSY (ESWL);  Surgeon: Alfredo MartinezMacDiarmid, Scott, MD;  [facility];  Service: Urology;  Laterality: Right;  . EXTRACORPOREAL SHOCK WAVE LITHOTRIPSY  05/2017  . KNEE ARTHROSCOPY W/ MENISCAL REPAIR    . SHOULDER ARTHROSCOPY     Nov 2019  . TONSILLECTOMY AND ADENOIDECTOMY    . TYMPANOPLASTY    . TYMPANOSTOMY TUBE PLACEMENT      There were no vitals filed for this visit.    Subjective Assessment - 12/30/20 1303    Subjective Shelly Porter is a 20 y/o female who presents to OPPT for neck pain and Lt sided radiculopathy x 3-4 months.  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  Prior hx of Lt shoulder surgery in 2019 for instability and pain intermittent since that time.    Pertinent History Lt shoulder surgery    Limitations Lifting;House hold activities    Diagnostic tests none recently    Patient Stated Goals improve pain, possible dry needling    Currently in Pain? Yes    Pain Score 6    up to 8/10; at best 3/10   Pain Location Scapula    Pain Orientation Left    Pain Descriptors / Indicators Burning;Tingling;Tightness    Pain Type Acute pain;Chronic pain    Pain Radiating Towards scapula radiating up to back up neck    Pain Onset More than a month ago    Pain Frequency Constant    Aggravating Factors  lifting, pushing, sitting with arms up, reaching overhead    Pain Relieving Factors stretching              [facility] Shelly Porter Assessment - 12/30/20 1308      Assessment   Medical Diagnosis cervical radiculopathy    Referring Provider (Shelly Porter) Julien GirtKirstin Shepperson, PA-C    Onset Date/Surgical Date --   Oct 2021   Hand Dominance Right    Next MD Visit PRN  Prior Therapy 2021 (ended in Feb) -for same condition      Precautions   Precautions None      Restrictions   Weight Bearing Restrictions No      Balance Screen   Has the patient fallen in the past 6 months No    Has the patient had a decrease in activity level because of a fear of falling?  No    Is the patient reluctant to leave their home because of a fear of falling?  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; entry level positions; [employer] student 12 credit hours all online ([employer]) then transfer to [employer] nothing; no regular exercise      Cognition   Overall Cognitive Status Within  Functional Limits for tasks assessed      Observation/Other Assessments   Focus on Therapeutic Outcomes (FOTO)  49 (predicted 64)      ROM / Strength   AROM / PROM / Strength AROM;Strength      AROM   Overall AROM Comments Lt shoulder FIR to T12/L1; abduction 134 (passive full)    AROM Assessment Site Cervical    Cervical Flexion 51   with pain   Cervical Extension 29    Cervical - Right Side Bend 30    Cervical - Left Side Bend 30    Cervical - Right Rotation 58    Cervical - Left Rotation 59      Strength   Overall Strength Comments pain with shoulder testing    Strength Assessment Site Shoulder    Right/Left Shoulder Right;Left    Right Shoulder Flexion 4/5    Right Shoulder ABduction 4/5    Right Shoulder Internal Rotation 5/5    Right Shoulder External Rotation 4/5    Left Shoulder Flexion 4-/5    Left Shoulder ABduction 2/5    Left Shoulder Internal Rotation 5/5    Left Shoulder External Rotation 3/5      Palpation   Palpation comment very tender to light palpation along Lt paraspinals, rhomboids and cervical paraspinals; trigger points noted in rhomboid on Lt      Special Tests    Special Tests Cervical    Cervical Tests Spurling's;Dictraction      Spurling's   Comment pain Lt occiput with Rt side testing; Lt side testing negative      Distraction Test   Findngs Positive    Comment improved symptoms                      Objective measurements completed on examination: See above findings.       [facility] Adult Shelly Porter Treatment/Exercise - 12/30/20 1347      Exercises   Exercises Other Exercises    Other Exercises  see Shelly Porter instructions - perforemd 1-2 reps of each exercise      Manual Therapy   Manual Therapy Soft tissue mobilization    Manual therapy comments skilled palpation and monitoring of soft tissue during DN    Soft tissue mobilization STM and compression to Lt rhomboids            Trigger Point Dry Needling - 12/30/20 1348  Consent Given? Yes    Education Handout Provided Yes    Muscles Treated Upper Quadrant Rhomboids;Subscapularis    Rhomboids Response Twitch response elicited    Subscapularis Response Twitch response elicited                Shelly Porter Education - 12/30/20 1340    Education Details HEP, DN    Person(s) Educated Patient    Methods Explanation;Handout;Demonstration    Comprehension Verbalized understanding;Returned demonstration;Need further instruction            Shelly Porter Short Term Goals - 12/30/20 1404      Shelly Porter SHORT TERM GOAL #1   Title independent with initial HEP    Status New    Target Date 01/20/21             Shelly Porter Long Term Goals - 12/30/20 1404      Shelly Porter LONG TERM GOAL #1   Title independent with final HEP    Status New    Target Date 02/10/21      Shelly Porter LONG TERM GOAL #2   Title demonstrate Lt shoulder abduction at least 160 deg without increase in pain    Status New    Target Date 02/10/21      Shelly Porter LONG TERM GOAL #3   Title report pain < 4/10 in scapular area for improved function    Status New    Target Date 02/10/21      Shelly Porter LONG TERM GOAL #4   Title demonstrate 4/5 Lt shoulder strength for improved function    Status New    Target Date 02/10/21      Shelly Porter LONG TERM GOAL #5   Title FOTO score improved to at least 64 for improved function    Status New    Target Date 02/10/21                  Plan - 12/30/20 1341    Clinical Impression Statement Shelly Porter is a 20 y/o female who presents to OPPT for Lt sided neck and shoulder pain.  Shelly Porter demonstrates active trigger points, decreased strength and ROM as well as some radicular symptoms and will benefit from Shelly Porter to address deficits listed.    Personal Factors and Comorbidities Comorbidity 3+    Comorbidities ADHD, anxiety, asthma, depression, PTSD    Examination-Activity Limitations Sleep;Carry;Lift;Reach Overhead;Hygiene/Grooming;Dressing    Examination-Participation Restrictions School;Community Activity     Stability/Clinical Decision Making Evolving/Moderate complexity    Clinical Decision Making Moderate    Rehab Potential Good    Shelly Porter Frequency 2x / week    Shelly Porter Duration 6 weeks    Shelly Porter Treatment/Interventions ADLs/Self Care Home Management;Cryotherapy;Electrical Stimulation;Moist Heat;Iontophoresis 4mg /ml Dexamethasone;Traction;Therapeutic exercise;Therapeutic activities;Neuromuscular re-education;Patient/family education;Manual techniques;Taping;Dry needling;Passive range of motion    Shelly Porter Next Visit Plan assess response to DN, review HEP and add postural exercises, consider traction trial since distraction was helpful    Shelly Porter Home Exercise Plan Access Code: [email or web address]    Consulted and Agree with Plan of Care Patient           Patient will benefit from skilled therapeutic intervention in order to improve the following deficits and impairments:  Increased fascial restricitons,Increased muscle spasms,Pain,Impaired UE functional use,Decreased strength,Decreased range of motion,Postural dysfunction  Visit Diagnosis: Radiculopathy, cervicothoracic region - Plan: Shelly Porter plan of care cert/re-cert  Other symptoms and signs involving the musculoskeletal system - Plan: Shelly Porter plan of care cert/re-cert  Muscle weakness (generalized) - Plan: Shelly Porter plan of care cert/re-cert     Problem  List Patient Active Problem List   Diagnosis Date Noted  . MDD (major depressive disorder) 07/31/2019  . Major depressive disorder, recurrent episode, moderate (HCC) 04/27/2019  . MDD (major depressive disorder), recurrent severe, without psychosis (HCC) 04/04/2019  . ADHD, hyperactive-impulsive type 04/04/2019  . GAD (generalized anxiety disorder) 04/04/2019  . Chronic post-traumatic stress disorder (PTSD) 04/04/2019      Shelly Porter, Shelly Porter, Shelly Porter 12/30/20 2:11 PM     [facility] Physical Therapy [address] [place], [place], [address] Phone: [phone]   Fax:  [phone]  Name: Shelly Porter MRN: [id number] Date of Birth: Dec 09, 2000

## 2020-12-30 NOTE — Patient Instructions (Signed)
Access Code: [email or web address] URL: [email or web address] Date: 12/30/2020 Prepared by: Moshe Cipro  Exercises Standing Backward Shoulder Rolls - 5-7 x daily - 7 x weekly - 1 sets - 10 reps Seated Cervical Retraction - 5-7 x daily - 7 x weekly - 1 sets - 3-5 reps - 5 sec hold Standing Shoulder Flexion to 90 Degrees with Dumbbells - 1 x daily - 7 x weekly - 3 sets - 10 reps Shoulder Abduction with Dumbbells - Thumbs Up - 1 x daily - 7 x weekly - 3 sets - 10 reps  Patient Education Trigger Point Dry Needling

## 2021-01-09 ENCOUNTER — Encounter: Payer: Self-pay | Admitting: Rehabilitative and Restorative Service Providers"

## 2021-01-09 ENCOUNTER — Other Ambulatory Visit: Payer: Self-pay

## 2021-01-09 ENCOUNTER — Ambulatory Visit: Payer: 59 | Admitting: Rehabilitative and Restorative Service Providers"

## 2021-01-09 DIAGNOSIS — M6281 Muscle weakness (generalized): Secondary | ICD-10-CM

## 2021-01-09 DIAGNOSIS — R29898 Other symptoms and signs involving the musculoskeletal system: Secondary | ICD-10-CM | POA: Diagnosis not present

## 2021-01-09 DIAGNOSIS — M5413 Radiculopathy, cervicothoracic region: Secondary | ICD-10-CM | POA: Diagnosis not present

## 2021-01-09 NOTE — Therapy (Signed)
[facility] Physical Therapy [address] [place], [place], [address] Phone: [phone]   Fax:  [phone]  Physical Therapy Treatment  [name] MRN: [id number] Date of Birth: [birth year] Referring Provider (PT): Julien Girt, [place]   Encounter Date: 01/09/2021   PT End of Session - 01/09/21 0909    Visit Number 2    Number of Visits 12    Date for PT Re-Evaluation 02/10/21    Progress Note Due on Visit 10    PT Start Time 0844    PT Stop Time 0925    PT Time Calculation (min) 41 min    Activity Tolerance Patient tolerated treatment well    Behavior During Therapy [facility] for tasks assessed/performed           Past Medical History:  Diagnosis Date  . ADHD (attention deficit hyperactivity disorder)   . Anxiety   . Asthma   . Complication of anesthesia    emotional wiith Versed and " my body temp drops."   . Family history of adverse reaction to anesthesia    "my mom has a low body temp with anesthesia and she gets nauseous too."   . Headache    cluster and migraine  . History of kidney stones   . Serum sickness due to drug    Reaction with Doxycycline    Past Surgical History:  Procedure Laterality Date  . ADENOIDECTOMY    . EXTRACORPOREAL SHOCK WAVE LITHOTRIPSY Right 08/15/2020   Procedure: RIGHT EXTRACORPOREAL SHOCK WAVE LITHOTRIPSY (ESWL);  Surgeon: Alfredo Martinez, MD;  [facility];  Service: Urology;  Laterality: Right;  . EXTRACORPOREAL SHOCK WAVE LITHOTRIPSY  05/2017  . KNEE ARTHROSCOPY W/ MENISCAL REPAIR    . SHOULDER ARTHROSCOPY     Nov 2019  . TONSILLECTOMY AND ADENOIDECTOMY    . TYMPANOPLASTY    . TYMPANOSTOMY TUBE PLACEMENT      There were no vitals filed for this visit.   Subjective Assessment - 01/09/21 0906    Subjective Pt. indicated feeling some improvement in symptoms overall but still noted 5/10 today upon arrival in mid back Lt and also in tension in back of  neck.  Thought treatment was helpful.    Pertinent History Lt shoulder surgery    Limitations Lifting;House hold activities    Diagnostic tests none recently    Patient Stated Goals improve pain, possible dry needling    Currently in Pain? Yes    Pain Score 5     Pain Location Back   Mid thoracic Lt, posterior scap, posterior cervical   Pain Orientation Left;Posterior    Pain Descriptors / Indicators Burning;Tightness    Pain Type Acute pain;Chronic pain    Pain Onset More than a month ago    Pain Frequency Intermittent    Aggravating Factors  insidious, certain lying down positioning.    Pain Relieving Factors last visit treatment                             [facility] Adult PT Treatment/Exercise - 01/09/21 0001      Exercises   Exercises Neck;Other Exercises    Other Exercises  HEP verbal review      Neck Exercises: Prone   Other Prone Exercise qruped SA press hold 3 seconds, scap retraction hold 3 seconds x 10    Other Prone Exercise qruped thoracic rotation c flexion/extension 3 sec hold x 10  each side (monitor Lt shoulder pain)      Modalities   Modalities Traction      Traction   Type of Traction Cervical    Min (lbs) 0    Max (lbs) 12    Hold Time 50    Rest Time 10    Time 15      Manual Therapy   Manual therapy comments palpation and compression to trigger points in thoracic paraspinals mid.  G4 regional rotational PA thoracic mobs mid thoracic (cavitation noted)                  PT Education - 01/09/21 0908    Education Details Added two thoracic mobility exercises to HEP, traction education.    Person(s) Educated Patient    Methods Demonstration;Explanation;Verbal cues;Handout    Comprehension Returned demonstration;Verbalized understanding            PT Short Term Goals - 01/09/21 0908      PT SHORT TERM GOAL #1   Title independent with initial HEP    Status On-going    Target Date 01/20/21             PT Long Term  Goals - 12/30/20 1404      PT LONG TERM GOAL #1   Title independent with final HEP    Status New    Target Date 02/10/21      PT LONG TERM GOAL #2   Title demonstrate Lt shoulder abduction at least 160 deg without increase in pain    Status New    Target Date 02/10/21      PT LONG TERM GOAL #3   Title report pain < 4/10 in scapular area for improved function    Status New    Target Date 02/10/21      PT LONG TERM GOAL #4   Title demonstrate 4/5 Lt shoulder strength for improved function    Status New    Target Date 02/10/21      PT LONG TERM GOAL #5   Title FOTO score improved to at least 64 for improved function    Status New    Target Date 02/10/21                 Plan - 01/09/21 0908    Clinical Impression Statement Positive initial response to treatment reported as well as good recall of HEP at this time.  In addition to myofascial restriction, PAIVM in thoracic region was limited today and treated c manual intervention and additional HEP exercise.  Traction use performed c no adverse reactions.    Personal Factors and Comorbidities Comorbidity 3+    Comorbidities ADHD, anxiety, asthma, depression, PTSD    Examination-Activity Limitations Sleep;Carry;Lift;Reach Overhead;Hygiene/Grooming;Dressing    Examination-Participation Restrictions School;Community Activity    Stability/Clinical Decision Making Evolving/Moderate complexity    Rehab Potential Good    PT Frequency 2x / week    PT Duration 6 weeks    PT Treatment/Interventions ADLs/Self Care Home Management;Cryotherapy;Electrical Stimulation;Moist Heat;Iontophoresis 4mg /ml Dexamethasone;Traction;Therapeutic exercise;Therapeutic activities;Neuromuscular re-education;Patient/family education;Manual techniques;Taping;Dry needling;Passive range of motion    PT Next Visit Plan DN prn, thoracic mobility intervention, traction again based off symptom response.    PT Home Exercise Plan Access Code: [email or web address]    Consulted  and Agree with Plan of Care Patient           Patient will benefit from skilled therapeutic intervention in order to improve the following deficits and impairments:  Increased fascial restricitons,Increased  muscle spasms,Pain,Impaired UE functional use,Decreased strength,Decreased range of motion,Postural dysfunction  Visit Diagnosis: Radiculopathy, cervicothoracic region  Other symptoms and signs involving the musculoskeletal system  Muscle weakness (generalized)     Problem List Patient Active Problem List   Diagnosis Date Noted  . MDD (major depressive disorder) 07/31/2019  . Major depressive disorder, recurrent episode, moderate (HCC) 04/27/2019  . MDD (major depressive disorder), recurrent severe, without psychosis (HCC) 04/04/2019  . ADHD, hyperactive-impulsive type 04/04/2019  . GAD (generalized anxiety disorder) 04/04/2019  . Chronic post-traumatic stress disorder (PTSD) 04/04/2019   Chyrel Masson, PT, DPT, OCS, ATC 01/09/21  9:13 AM    [facility] Physical Therapy [address] [place], [place], [address] Phone: [phone]   Fax:  [phone]  Name: Shelly Porter MRN: [id number] Date of Birth: 12/10/2000

## 2021-01-09 NOTE — Patient Instructions (Signed)
Access Code: [email or web address] URL: [email or web address] Date: 01/09/2021 Prepared by: Chyrel Masson  Exercises Standing Backward Shoulder Rolls - 5-7 x daily - 7 x weekly - 1 sets - 10 reps Seated Cervical Retraction - 5-7 x daily - 7 x weekly - 1 sets - 3-5 reps - 5 sec hold Standing Shoulder Flexion to 90 Degrees with Dumbbells - 1 x daily - 7 x weekly - 3 sets - 10 reps Shoulder Abduction with Dumbbells - Thumbs Up - 1 x daily - 7 x weekly - 3 sets - 10 reps Quadruped Thoracic Rotation - Reach Under - 2 x daily - 7 x weekly - 1 sets - 10 reps - 3 hold Quadruped Thoracic Rotation Full Range with Hand on Neck - 2 x daily - 7 x weekly - 1 sets - 10 reps - 3 hold Cat-Camel - 2 x daily - 7 x weekly - 1 sets - 10-15 reps  Patient Education Trigger Point Dry Needling

## 2021-01-15 ENCOUNTER — Encounter: Payer: Self-pay | Admitting: Physical Therapy

## 2021-01-15 ENCOUNTER — Other Ambulatory Visit: Payer: Self-pay

## 2021-01-15 ENCOUNTER — Ambulatory Visit (INDEPENDENT_AMBULATORY_CARE_PROVIDER_SITE_OTHER): Payer: 59 | Admitting: Physical Therapy

## 2021-01-15 DIAGNOSIS — R29898 Other symptoms and signs involving the musculoskeletal system: Secondary | ICD-10-CM | POA: Diagnosis not present

## 2021-01-15 DIAGNOSIS — M6281 Muscle weakness (generalized): Secondary | ICD-10-CM | POA: Diagnosis not present

## 2021-01-15 DIAGNOSIS — M5413 Radiculopathy, cervicothoracic region: Secondary | ICD-10-CM

## 2021-01-15 NOTE — Therapy (Signed)
[facility] [address] [place], [place], [address] Phone: [phone]   Fax:  [phone]  Physical Therapy Treatment  [name] MRN: [id number] Date of Birth: February 16, 2001 Referring Provider (PT): Julien Girt, [place]   Encounter Date: 01/15/2021   PT End of Session - 01/15/21 1448    Visit Number 3    Number of Visits 12    Date for PT Re-Evaluation 02/10/21    Progress Note Due on Visit 10    PT Start Time 1353    PT Stop Time 1440    PT Time Calculation (min) 47 min    Activity Tolerance Patient tolerated treatment well    Behavior During Therapy [facility] for tasks assessed/performed           Past Medical History:  Diagnosis Date  . ADHD (attention deficit hyperactivity disorder)   . Anxiety   . Asthma   . Complication of anesthesia    emotional wiith Versed and " my body temp drops."   . Family history of adverse reaction to anesthesia    "my mom has a low body temp with anesthesia and she gets nauseous too."   . Headache    cluster and migraine  . History of kidney stones   . Serum sickness due to drug    Reaction with Doxycycline    Past Surgical History:  Procedure Laterality Date  . ADENOIDECTOMY    . EXTRACORPOREAL SHOCK WAVE LITHOTRIPSY Right 08/15/2020   Procedure: RIGHT EXTRACORPOREAL SHOCK WAVE LITHOTRIPSY (ESWL);  Surgeon: Alfredo Martinez, MD;  Location: [facility];  Service: Urology;  Laterality: Right;  . EXTRACORPOREAL SHOCK WAVE LITHOTRIPSY  05/2017  . KNEE ARTHROSCOPY W/ MENISCAL REPAIR    . SHOULDER ARTHROSCOPY     Nov 2019  . TONSILLECTOMY AND ADENOIDECTOMY    . TYMPANOPLASTY    . TYMPANOSTOMY TUBE PLACEMENT      There were no vitals filed for this visit.   Subjective Assessment - 01/15/21 1355    Subjective new HEP is more challenging and causes pain, but able to manage with heat/massage    Pertinent History Lt shoulder surgery    Limitations  Lifting;House hold activities    Diagnostic tests none recently    Patient Stated Goals improve pain, possible dry needling    Currently in Pain? Yes    Pain Score 4     Pain Location Back    Pain Orientation Mid;Upper    Pain Descriptors / Indicators Burning;Tingling    Pain Type Acute pain;Chronic pain    Pain Onset More than a month ago    Pain Frequency Intermittent    Aggravating Factors  certain positions    Pain Relieving Factors massage, heat, traction, DN                             [facility] Adult PT Treatment/Exercise - 01/15/21 1431      Self-Care   Self-Care Other Self-Care Comments    Other Self-Care Comments  discussed adaptation and loading and how to gradually increase demands to body - performed 1-2 reps of new HEP to work on strengthening of shoulders/back      Neck Exercises: Standing   Other Standing Exercises demonstrated bent over row      Neck Exercises: Prone   W Back --   3 reps     Modalities   Modalities Traction  Traction   Type of Traction Cervical    Min (lbs) 0    Max (lbs) 12    Hold Time 50    Rest Time 10    Time 12      Manual Therapy   Manual therapy comments palpation and compression to trigger points in thoracic paraspinals mid.            Trigger Point Dry Needling - 01/15/21 1431    Consent Given? Yes    Education Handout Provided Previously provided    Muscles Treated Back/Hip Thoracic multifidi    Electrical Stimulation Performed with Dry Needling Yes    E-stim with Dry Needling Details to T10/11 to tolerance x 5 min    Thoracic multifidi response Twitch response elicited                  PT Short Term Goals - 01/15/21 1448      PT SHORT TERM GOAL #1   Title independent with initial HEP    Status On-going    Target Date 01/20/21             PT Long Term Goals - 01/15/21 1448      PT LONG TERM GOAL #1   Title independent with final HEP    Status On-going    Target Date 02/10/21       PT LONG TERM GOAL #2   Title demonstrate Lt shoulder abduction at least 160 deg without increase in pain    Status On-going      PT LONG TERM GOAL #3   Title report pain < 4/10 in scapular area for improved function    Status New      PT LONG TERM GOAL #4   Title demonstrate 4/5 Lt shoulder strength for improved function    Status On-going      PT LONG TERM GOAL #5   Title FOTO score improved to at least 64 for improved function    Status On-going                 Plan - 01/15/21 1449    Clinical Impression Statement Pt tolerated session well today, adding 2 exercises to work on back strengthening as well.  She reports pain with exercises, but is able to manage and decrease with heat and massage where prior nothing would help with the pain.  Overall progressing well.  Repeated traction as she had positive response last visit.    Personal Factors and Comorbidities Comorbidity 3+    Comorbidities ADHD, anxiety, asthma, depression, PTSD    Examination-Activity Limitations Sleep;Carry;Lift;Reach Overhead;Hygiene/Grooming;Dressing    Examination-Participation Restrictions School;Community Activity    Stability/Clinical Decision Making Evolving/Moderate complexity    Rehab Potential Good    PT Frequency 2x / week    PT Duration 6 weeks    PT Treatment/Interventions ADLs/Self Care Home Management;Cryotherapy;Electrical Stimulation;Moist Heat;Iontophoresis 4mg /ml Dexamethasone;Traction;Therapeutic exercise;Therapeutic activities;Neuromuscular re-education;Patient/family education;Manual techniques;Taping;Dry needling;Passive range of motion    PT Next Visit Plan DN prn, thoracic mobility intervention, traction again based off symptom response; work on for her    PT Home Exercise Plan Access Code: [email or web address]    Consulted and Agree with Plan of Care Patient           Patient will benefit from skilled therapeutic intervention in order to improve the  following deficits and impairments:  Increased fascial restricitons,Increased muscle spasms,Pain,Impaired UE functional use,Decreased strength,Decreased range of motion,Postural dysfunction  Visit Diagnosis: Radiculopathy, cervicothoracic region  Other symptoms and signs involving the musculoskeletal system  Muscle weakness (generalized)     Problem List Patient Active Problem List   Diagnosis Date Noted  . MDD (major depressive disorder) 07/31/2019  . Major depressive disorder, recurrent episode, moderate (HCC) 04/27/2019  . MDD (major depressive disorder), recurrent severe, without psychosis (HCC) 04/04/2019  . ADHD, hyperactive-impulsive type 04/04/2019  . GAD (generalized anxiety disorder) 04/04/2019  . Chronic post-traumatic stress disorder (PTSD) 04/04/2019      Clarita Crane, PT, DPT 01/15/21 2:52 PM     [facility] [address] [place], [place], [address] Phone: [phone]   Fax:  [phone]  Name: Shelly Porter MRN: [id number] Date of Birth: 2001-08-23

## 2021-01-17 ENCOUNTER — Encounter: Payer: Self-pay | Admitting: Rehabilitative and Restorative Service Providers"

## 2021-01-17 ENCOUNTER — Ambulatory Visit: Payer: 59 | Admitting: Rehabilitative and Restorative Service Providers"

## 2021-01-17 ENCOUNTER — Other Ambulatory Visit: Payer: Self-pay

## 2021-01-17 DIAGNOSIS — M5413 Radiculopathy, cervicothoracic region: Secondary | ICD-10-CM | POA: Diagnosis not present

## 2021-01-17 DIAGNOSIS — M6281 Muscle weakness (generalized): Secondary | ICD-10-CM

## 2021-01-17 DIAGNOSIS — R29898 Other symptoms and signs involving the musculoskeletal system: Secondary | ICD-10-CM | POA: Diagnosis not present

## 2021-01-17 NOTE — Therapy (Signed)
[facility] [address] [place], [place], [address] Phone: [phone]   Fax:  [phone]  Physical Therapy Treatment  [name] MRN: [id number] Date of Birth: [birth year] Referring Provider (PT): Julien Girt, [place]   Encounter Date: 01/17/2021   PT End of Session - 01/17/21 1303    Visit Number 4    Number of Visits 12    Date for PT Re-Evaluation 02/10/21    Progress Note Due on Visit 10    PT Start Time 1255    PT Stop Time 1335    PT Time Calculation (min) 40 min    Activity Tolerance Patient tolerated treatment well    Behavior During Therapy [facility] for tasks assessed/performed           Past Medical History:  Diagnosis Date  . ADHD (attention deficit hyperactivity disorder)   . Anxiety   . Asthma   . Complication of anesthesia    emotional wiith Versed and " my body temp drops."   . Family history of adverse reaction to anesthesia    "my mom has a low body temp with anesthesia and she gets nauseous too."   . Headache    cluster and migraine  . History of kidney stones   . Serum sickness due to drug    Reaction with Doxycycline    Past Surgical History:  Procedure Laterality Date  . ADENOIDECTOMY    . EXTRACORPOREAL SHOCK WAVE LITHOTRIPSY Right 08/15/2020   Procedure: RIGHT EXTRACORPOREAL SHOCK WAVE LITHOTRIPSY (ESWL);  Surgeon: Alfredo Martinez, MD;  Location: [facility];  Service: Urology;  Laterality: Right;  . EXTRACORPOREAL SHOCK WAVE LITHOTRIPSY  05/2017  . KNEE ARTHROSCOPY W/ MENISCAL REPAIR    . SHOULDER ARTHROSCOPY     Nov 2019  . TONSILLECTOMY AND ADENOIDECTOMY    . TYMPANOPLASTY    . TYMPANOSTOMY TUBE PLACEMENT      There were no vitals filed for this visit.   Subjective Assessment - 01/17/21 1300    Subjective Pt. stated 2/10 or so today, ulitemately reported better than arrival last visit per Pt.   Pt. indicated the day or so after last visit she felt  like she went to hard on exercises and was very sore but it wore off after a day or rest.    Pertinent History Lt shoulder surgery    Limitations Lifting;House hold activities    Diagnostic tests none recently    Patient Stated Goals improve pain, possible dry needling    Currently in Pain? Yes    Pain Score 2     Pain Location Back    Pain Orientation Mid;Upper    Pain Descriptors / Indicators Burning;Tingling    Pain Type Chronic pain;Acute pain    Pain Onset More than a month ago    Pain Frequency Intermittent    Aggravating Factors  increased soreness after exercise              [facility] PT Assessment - 01/17/21 0001      Assessment   Medical Diagnosis cervical radiculopathy    Referring Provider (PT) Julien Girt, PA-C    Onset Date/Surgical Date --   Oct 2021     AROM   Cervical Flexion 60    Cervical Extension 54    Cervical - Right Rotation 60    Cervical - Left Rotation 62  [facility] Adult PT Treatment/Exercise - 01/17/21 0001      Neck Exercises: Machines for Strengthening   UBE (Upper Arm Bike) Lvl 3.5 3 mins fwd/back (protraction/retraction focus)      Neck Exercises: Theraband   Shoulder Extension Green   2 x 15   Rows Green   2 x 15   Other Theraband Exercises ER reactive step out standing green band 10x 5 sec bilateral      Traction   Type of Traction Cervical    Min (lbs) 0    Max (lbs) 12    Hold Time 50    Rest Time 10    Time 15                    PT Short Term Goals - 01/15/21 1448      PT SHORT TERM GOAL #1   Title independent with initial HEP    Status On-going    Target Date 01/20/21             PT Long Term Goals - 01/15/21 1448      PT LONG TERM GOAL #1   Title independent with final HEP    Status On-going    Target Date 02/10/21      PT LONG TERM GOAL #2   Title demonstrate Lt shoulder abduction at least 160 deg without increase in pain    Status On-going      PT LONG  TERM GOAL #3   Title report pain < 4/10 in scapular area for improved function    Status New      PT LONG TERM GOAL #4   Title demonstrate 4/5 Lt shoulder strength for improved function    Status On-going      PT LONG TERM GOAL #5   Title FOTO score improved to at least 64 for improved function    Status On-going                 Plan - 01/17/21 1320    Clinical Impression Statement After discussion with patient about post exercise and visit soreness, clinical decision made today to hold manual intervention to reduce post visit soreness to allow patient to fully assess exercise related soreness complaints without manual soreness involvement.  Overall patient has continued to report severity of symptoms reduced compared to evaluation.  Cervical ROM improving as documented.    Personal Factors and Comorbidities Comorbidity 3+    Comorbidities ADHD, anxiety, asthma, depression, PTSD    Examination-Activity Limitations Sleep;Carry;Lift;Reach Overhead;Hygiene/Grooming;Dressing    Examination-Participation Restrictions School;Community Activity    Stability/Clinical Decision Making Evolving/Moderate complexity    Clinical Decision Making Moderate    Rehab Potential Good    PT Frequency 2x / week    PT Duration 6 weeks    PT Treatment/Interventions ADLs/Self Care Home Management;Cryotherapy;Electrical Stimulation;Moist Heat;Iontophoresis 4mg /ml Dexamethasone;Traction;Therapeutic exercise;Therapeutic activities;Neuromuscular re-education;Patient/family education;Manual techniques;Taping;Dry needling;Passive range of motion    PT Next Visit Plan Manual intervention, dry needling if desired.  Continue work on for improve postural endurance/activiation    PT Home Exercise Plan Access Code: [email or web address]    Consulted and Agree with Plan of Care Patient           Patient will benefit from skilled therapeutic intervention in order to improve the following deficits and  impairments:  Increased fascial restricitons,Increased muscle spasms,Pain,Impaired UE functional use,Decreased strength,Decreased range of motion,Postural dysfunction  Visit Diagnosis: Radiculopathy, cervicothoracic region  Other symptoms and signs involving the musculoskeletal  system  Muscle weakness (generalized)     Problem List Patient Active Problem List   Diagnosis Date Noted  . MDD (major depressive disorder) 07/31/2019  . Major depressive disorder, recurrent episode, moderate (HCC) 04/27/2019  . MDD (major depressive disorder), recurrent severe, without psychosis (HCC) 04/04/2019  . ADHD, hyperactive-impulsive type 04/04/2019  . GAD (generalized anxiety disorder) 04/04/2019  . Chronic post-traumatic stress disorder (PTSD) 04/04/2019    Chyrel Masson, PT, DPT, OCS, ATC 01/17/21  1:23 PM    [facility] [address] [place], [place], [address] Phone: [phone]   Fax:  [phone]  Name: Shelly Porter MRN: [id number] Date of Birth: May 12, 2001

## 2021-01-21 ENCOUNTER — Encounter: Payer: Self-pay | Admitting: Physical Therapy

## 2021-01-21 ENCOUNTER — Other Ambulatory Visit: Payer: Self-pay

## 2021-01-21 ENCOUNTER — Ambulatory Visit: Payer: 59 | Admitting: Physical Therapy

## 2021-01-21 DIAGNOSIS — M6281 Muscle weakness (generalized): Secondary | ICD-10-CM

## 2021-01-21 DIAGNOSIS — R29898 Other symptoms and signs involving the musculoskeletal system: Secondary | ICD-10-CM

## 2021-01-21 DIAGNOSIS — M5413 Radiculopathy, cervicothoracic region: Secondary | ICD-10-CM | POA: Diagnosis not present

## 2021-01-21 NOTE — Therapy (Signed)
[facility] [address] [place], [place], [address] Phone: [phone]   Fax:  [phone]  Physical Therapy Treatment  [name] MRN: [id number] Date of Birth: [birth year] Referring Provider (PT): Julien Girt, [place]   Encounter Date: 01/21/2021   PT End of Session - 01/21/21 1406    Visit Number 5    Number of Visits 12    Date for PT Re-Evaluation 02/10/21    Progress Note Due on Visit 10    PT Start Time 1257    PT Stop Time 1339    PT Time Calculation (min) 42 min    Activity Tolerance Patient tolerated treatment well    Behavior During Therapy [facility] for tasks assessed/performed           Past Medical History:  Diagnosis Date  . ADHD (attention deficit hyperactivity disorder)   . Anxiety   . Asthma   . Complication of anesthesia    emotional wiith Versed and " my body temp drops."   . Family history of adverse reaction to anesthesia    "my mom has a low body temp with anesthesia and she gets nauseous too."   . Headache    cluster and migraine  . History of kidney stones   . Serum sickness due to drug    Reaction with Doxycycline    Past Surgical History:  Procedure Laterality Date  . ADENOIDECTOMY    . EXTRACORPOREAL SHOCK WAVE LITHOTRIPSY Right 08/15/2020   Procedure: RIGHT EXTRACORPOREAL SHOCK WAVE LITHOTRIPSY (ESWL);  Surgeon: Alfredo Martinez, MD;  Location: [facility];  Service: Urology;  Laterality: Right;  . EXTRACORPOREAL SHOCK WAVE LITHOTRIPSY  05/2017  . KNEE ARTHROSCOPY W/ MENISCAL REPAIR    . SHOULDER ARTHROSCOPY     Nov 2019  . TONSILLECTOMY AND ADENOIDECTOMY    . TYMPANOPLASTY    . TYMPANOSTOMY TUBE PLACEMENT      There were no vitals filed for this visit.   Subjective Assessment - 01/21/21 1258    Subjective really likes the exercises, overall feeling better    Pertinent History Lt shoulder surgery    Limitations Lifting;House hold activities     Diagnostic tests none recently    Patient Stated Goals improve pain, possible dry needling    Currently in Pain? Yes    Pain Score 3    1/10 in shoulder   Pain Location Neck    Pain Orientation Mid;Upper;Left    Pain Descriptors / Indicators Burning;Tingling    Pain Type Chronic pain;Acute pain    Pain Onset More than a month ago    Pain Frequency Intermittent    Aggravating Factors  increased soreness after exercise    Pain Relieving Factors massage, heat, traction, DN                             [facility] Adult PT Treatment/Exercise - 01/21/21 1303      Neck Exercises: Machines for Strengthening   UBE (Upper Arm Bike) Lvl 4 x 3 mins fwd/back (protraction/retraction focus)      Neck Exercises: Theraband   Shoulder Extension Blue   3x10   Rows Blue   3x10   Other Theraband Exercises IR/ER walk out L4 band x 20 reps bil      Neck Exercises: Standing   Other Standing Exercises overhead press x20 reps; 2# bil    Other Standing Exercises shoulder flexion and abduction x  20 reps bil (switched to sitting due to lightheadedness)      Traction   Type of Traction --   deferred today                   PT Short Term Goals - 01/21/21 1406      PT SHORT TERM GOAL #1   Title independent with initial HEP    Status Achieved    Target Date 01/20/21             PT Long Term Goals - 01/15/21 1448      PT LONG TERM GOAL #1   Title independent with final HEP    Status On-going    Target Date 02/10/21      PT LONG TERM GOAL #2   Title demonstrate Lt shoulder abduction at least 160 deg without increase in pain    Status On-going      PT LONG TERM GOAL #3   Title report pain < 4/10 in scapular area for improved function    Status New      PT LONG TERM GOAL #4   Title demonstrate 4/5 Lt shoulder strength for improved function    Status On-going      PT LONG TERM GOAL #5   Title FOTO score improved to at least 64 for improved function    Status  On-going                 Plan - 01/21/21 1406    Clinical Impression Statement Pt tolerated session well today with minimal c/o pain, and overall progressing well with PT.  Deferred traction and DN today as pt doing well, and will plan to assess need at next visit.  Will continue to benefit from PT to maximize function.    Personal Factors and Comorbidities Comorbidity 3+    Comorbidities ADHD, anxiety, asthma, depression, PTSD    Examination-Activity Limitations Sleep;Carry;Lift;Reach Overhead;Hygiene/Grooming;Dressing    Examination-Participation Restrictions School;Community Activity    Stability/Clinical Decision Making Evolving/Moderate complexity    Rehab Potential Good    PT Frequency 2x / week    PT Duration 6 weeks    PT Treatment/Interventions ADLs/Self Care Home Management;Cryotherapy;Electrical Stimulation;Moist Heat;Iontophoresis 4mg /ml Dexamethasone;Traction;Therapeutic exercise;Therapeutic activities;Neuromuscular re-education;Patient/family education;Manual techniques;Taping;Dry needling;Passive range of motion    PT Next Visit Plan Manual intervention, dry needling if desired.  Continue work on for improve postural endurance/activiation    PT Home Exercise Plan Access Code: [email or web address]    Consulted and Agree with Plan of Care Patient           Patient will benefit from skilled therapeutic intervention in order to improve the following deficits and impairments:  Increased fascial restricitons,Increased muscle spasms,Pain,Impaired UE functional use,Decreased strength,Decreased range of motion,Postural dysfunction  Visit Diagnosis: Radiculopathy, cervicothoracic region  Other symptoms and signs involving the musculoskeletal system  Muscle weakness (generalized)     Problem List Patient Active Problem List   Diagnosis Date Noted  . MDD (major depressive disorder) 07/31/2019  . Major depressive disorder, recurrent episode, moderate (HCC)  04/27/2019  . MDD (major depressive disorder), recurrent severe, without psychosis (HCC) 04/04/2019  . ADHD, hyperactive-impulsive type 04/04/2019  . GAD (generalized anxiety disorder) 04/04/2019  . Chronic post-traumatic stress disorder (PTSD) 04/04/2019      04/06/2019, PT, DPT 01/21/21 2:08 PM    [facility] [address] [place], [place], [place] Phone: [phone]   Fax:  [phone]  Name: Shelly Porter MRN:  [id number] Date of Birth: 06/29/2001

## 2021-01-23 ENCOUNTER — Other Ambulatory Visit: Payer: Self-pay

## 2021-01-23 ENCOUNTER — Encounter: Payer: Self-pay | Admitting: Physical Therapy

## 2021-01-23 ENCOUNTER — Ambulatory Visit: Payer: 59 | Admitting: Physical Therapy

## 2021-01-23 DIAGNOSIS — M5413 Radiculopathy, cervicothoracic region: Secondary | ICD-10-CM

## 2021-01-23 DIAGNOSIS — M6281 Muscle weakness (generalized): Secondary | ICD-10-CM | POA: Diagnosis not present

## 2021-01-23 DIAGNOSIS — R29898 Other symptoms and signs involving the musculoskeletal system: Secondary | ICD-10-CM

## 2021-01-23 NOTE — Therapy (Signed)
[facility] [address] [place], [place], [address] Phone: [phone]   Fax:  [phone]  Physical Therapy Treatment  [name] MRN: [id number] Date of Birth: 02/22/2001 Referring Provider (PT): Julien Girt, [place]   Encounter Date: 01/23/2021   PT End of Session - 01/23/21 1339    Visit Number 6    Number of Visits 12    Date for PT Re-Evaluation 02/10/21    Progress Note Due on Visit 10    PT Start Time 1300    PT Stop Time 1338    PT Time Calculation (min) 38 min    Activity Tolerance Patient tolerated treatment well    Behavior During Therapy [facility] for tasks assessed/performed           Past Medical History:  Diagnosis Date  . ADHD (attention deficit hyperactivity disorder)   . Anxiety   . Asthma   . Complication of anesthesia    emotional wiith Versed and " my body temp drops."   . Family history of adverse reaction to anesthesia    "my mom has a low body temp with anesthesia and she gets nauseous too."   . Headache    cluster and migraine  . History of kidney stones   . Serum sickness due to drug    Reaction with Doxycycline    Past Surgical History:  Procedure Laterality Date  . ADENOIDECTOMY    . EXTRACORPOREAL SHOCK WAVE LITHOTRIPSY Right 08/15/2020   Procedure: RIGHT EXTRACORPOREAL SHOCK WAVE LITHOTRIPSY (ESWL);  Surgeon: Alfredo Martinez, MD;  Location: [facility];  Service: Urology;  Laterality: Right;  . EXTRACORPOREAL SHOCK WAVE LITHOTRIPSY  05/2017  . KNEE ARTHROSCOPY W/ MENISCAL REPAIR    . SHOULDER ARTHROSCOPY     Nov 2019  . TONSILLECTOMY AND ADENOIDECTOMY    . TYMPANOPLASTY    . TYMPANOSTOMY TUBE PLACEMENT      There were no vitals filed for this visit.   Subjective Assessment - 01/23/21 1259    Subjective having increased anxiety today - no known trigger today. saw counselor yesterday.    Pertinent History Lt shoulder surgery    Limitations  Lifting;House hold activities    Diagnostic tests none recently    Patient Stated Goals improve pain, possible dry needling    Currently in Pain? No/denies                             [facility] Adult PT Treatment/Exercise - 01/23/21 1306      Neck Exercises: Machines for Strengthening   UBE (Upper Arm Bike) Lvl 4 x 3 mins fwd/back (protraction/retraction focus)    Cybex Row 10# 3x10    Lat Pull 10# 3x10      Neck Exercises: Standing   Other Standing Exercises horizontal abduction/adduction with 90 deg elbow flex x 20 bil; bicep curls with overhead press x 20 reps bil    Other Standing Exercises shoulder flexion and abduction x 20 reps bil, 2#                    PT Short Term Goals - 01/21/21 1406      PT SHORT TERM GOAL #1   Title independent with initial HEP    Status Achieved    Target Date 01/20/21             PT Long Term Goals - 01/15/21 1448  PT LONG TERM GOAL #1   Title independent with final HEP    Status On-going    Target Date 02/10/21      PT LONG TERM GOAL #2   Title demonstrate Lt shoulder abduction at least 160 deg without increase in pain    Status On-going      PT LONG TERM GOAL #3   Title report pain < 4/10 in scapular area for improved function    Status New      PT LONG TERM GOAL #4   Title demonstrate 4/5 Lt shoulder strength for improved function    Status On-going      PT LONG TERM GOAL #5   Title FOTO score improved to at least 64 for improved function    Status On-going                 Plan - 01/23/21 1339    Clinical Impression Statement Pt reported feeling better at end of session and focused on exercise and strengthening today.  She anticipates she will be ready for d/c, so will plan to assess goals next week.    Personal Factors and Comorbidities Comorbidity 3+    Comorbidities ADHD, anxiety, asthma, depression, PTSD    Examination-Activity Limitations Sleep;Carry;Lift;Reach  Overhead;Hygiene/Grooming;Dressing    Examination-Participation Restrictions School;Community Activity    Stability/Clinical Decision Making Evolving/Moderate complexity    Rehab Potential Good    PT Frequency 2x / week    PT Duration 6 weeks    PT Treatment/Interventions ADLs/Self Care Home Management;Cryotherapy;Electrical Stimulation;Moist Heat;Iontophoresis 4mg /ml Dexamethasone;Traction;Therapeutic exercise;Therapeutic activities;Neuromuscular re-education;Patient/family education;Manual techniques;Taping;Dry needling;Passive range of motion    PT Next Visit Plan check goals, discuss d/c v/s continue    PT Home Exercise Plan Access Code: [email or web address]    Consulted and Agree with Plan of Care Patient           Patient will benefit from skilled therapeutic intervention in order to improve the following deficits and impairments:  Increased fascial restricitons,Increased muscle spasms,Pain,Impaired UE functional use,Decreased strength,Decreased range of motion,Postural dysfunction  Visit Diagnosis: Radiculopathy, cervicothoracic region  Other symptoms and signs involving the musculoskeletal system  Muscle weakness (generalized)     Problem List Patient Active Problem List   Diagnosis Date Noted  . MDD (major depressive disorder) 07/31/2019  . Major depressive disorder, recurrent episode, moderate (HCC) 04/27/2019  . MDD (major depressive disorder), recurrent severe, without psychosis (HCC) 04/04/2019  . ADHD, hyperactive-impulsive type 04/04/2019  . GAD (generalized anxiety disorder) 04/04/2019  . Chronic post-traumatic stress disorder (PTSD) 04/04/2019      04/06/2019, PT, DPT 01/23/21 1:41 PM    [facility] [address] [place], [place], [place] Phone: [phone]   Fax:  [phone]  Name: Shelly Porter MRN: [place] Date of Birth: 31-May-2001

## 2021-01-28 ENCOUNTER — Other Ambulatory Visit: Payer: Self-pay

## 2021-01-28 ENCOUNTER — Ambulatory Visit: Payer: 59 | Admitting: Physical Therapy

## 2021-01-28 ENCOUNTER — Encounter: Payer: Self-pay | Admitting: Physical Therapy

## 2021-01-28 DIAGNOSIS — M5413 Radiculopathy, cervicothoracic region: Secondary | ICD-10-CM | POA: Diagnosis not present

## 2021-01-28 DIAGNOSIS — M6281 Muscle weakness (generalized): Secondary | ICD-10-CM | POA: Diagnosis not present

## 2021-01-28 DIAGNOSIS — R29898 Other symptoms and signs involving the musculoskeletal system: Secondary | ICD-10-CM

## 2021-01-28 NOTE — Therapy (Signed)
[facility] Physical Therapy [address] [place], [place], [address] Phone: [phone]   Fax:  [phone]  Physical Therapy Treatment/Discharge Summary  [name] MRN: [id number] Date of Birth: 09/30/2001 Referring Provider (PT): Matthew Saras, [place]   Encounter Date: 01/28/2021   PT End of Session - 01/28/21 1338    Visit Number 7    Number of Visits 12    Date for PT Re-Evaluation 02/10/21    Progress Note Due on Visit 10    PT Start Time 1300    PT Stop Time 1330    PT Time Calculation (min) 30 min    Activity Tolerance Patient tolerated treatment well    Behavior During Therapy [facility] for tasks assessed/performed           Past Medical History:  Diagnosis Date  . ADHD (attention deficit hyperactivity disorder)   . Anxiety   . Asthma   . Complication of anesthesia    emotional wiith Versed and " my body temp drops."   . Family history of adverse reaction to anesthesia    "my mom has a low body temp with anesthesia and she gets nauseous too."   . Headache    cluster and migraine  . History of kidney stones   . Serum sickness due to drug    Reaction with Doxycycline    Past Surgical History:  Procedure Laterality Date  . ADENOIDECTOMY    . EXTRACORPOREAL SHOCK WAVE LITHOTRIPSY Right 08/15/2020   Procedure: RIGHT EXTRACORPOREAL SHOCK WAVE LITHOTRIPSY (ESWL);  Surgeon: Bjorn Loser, MD;  Location: [facility];  Service: Urology;  Laterality: Right;  . EXTRACORPOREAL SHOCK WAVE LITHOTRIPSY  05/2017  . KNEE ARTHROSCOPY W/ MENISCAL REPAIR    . SHOULDER ARTHROSCOPY     Nov 2019  . TONSILLECTOMY AND ADENOIDECTOMY    . TYMPANOPLASTY    . TYMPANOSTOMY TUBE PLACEMENT      There were no vitals filed for this visit.   Subjective Assessment - 01/28/21 1301    Subjective doing well - Rt sided mid back pain has returned.  Otherwise no other complaints    Pertinent History Lt shoulder surgery     Limitations Lifting;House hold activities    Diagnostic tests none recently    Patient Stated Goals improve pain, possible dry needling    Currently in Pain? Yes    Pain Score 2    up to 7/10   Pain Location Back    Pain Orientation Right;Mid    Pain Descriptors / Indicators Burning;Tingling    Pain Type Chronic pain;Acute pain    Pain Onset More than a month ago    Pain Frequency Intermittent    Aggravating Factors  soreness after exercise    Pain Relieving Factors massage, heat, traction, DN              [facility] PT Assessment - 01/28/21 1319      Assessment   Medical Diagnosis cervical radiculopathy    Referring Provider (PT) Matthew Saras, PA-C      Observation/Other Assessments   Focus on Therapeutic Outcomes (FOTO)  66      AROM   Overall AROM Comments Lt shoulder abdct 165      Strength   Left Shoulder Flexion 5/5    Left Shoulder ABduction 4/5    Left Shoulder Internal Rotation 5/5    Left Shoulder External Rotation 5/5  [facility] Adult PT Treatment/Exercise - 01/28/21 1337      Manual Therapy   Soft tissue mobilization STM and compression to lower thoracic paraspinals; percussive device following DN            Trigger Point Dry Needling - 01/28/21 1337    Consent Given? Yes    Education Handout Provided Previously provided    Muscles Treated Back/Hip Erector spinae    Erector spinae Response Twitch response elicited   thoracic                 PT Short Term Goals - 01/21/21 1406      PT SHORT TERM GOAL #1   Title independent with initial HEP    Status Achieved    Target Date 01/20/21             PT Long Term Goals - 01/28/21 1338      PT LONG TERM GOAL #1   Title independent with final HEP    Status Achieved      PT LONG TERM GOAL #2   Title demonstrate Lt shoulder abduction at least 160 deg without increase in pain    Status Achieved      PT LONG TERM GOAL #3   Title report pain < 4/10  in scapular area for improved function    Status Achieved      PT LONG TERM GOAL #4   Title demonstrate 4/5 Lt shoulder strength for improved function    Status Achieved      PT LONG TERM GOAL #5   Title FOTO score improved to at least 64 for improved function    Status Achieved                 Plan - 01/28/21 1338    Clinical Impression Statement Pt has met all goals and is ready for d/c from PT today.  Encouraged continued strengthening focus maintain shoulder strength and decrease risk of reinjury.    Personal Factors and Comorbidities Comorbidity 3+    Comorbidities ADHD, anxiety, asthma, depression, PTSD    Examination-Activity Limitations Sleep;Carry;Lift;Reach Overhead;Hygiene/Grooming;Dressing    Examination-Participation Restrictions School;Community Activity    Stability/Clinical Decision Making Evolving/Moderate complexity    Rehab Potential Good    PT Frequency 2x / week    PT Duration 6 weeks    PT Treatment/Interventions ADLs/Self Care Home Management;Cryotherapy;Electrical Stimulation;Moist Heat;Iontophoresis 4mg /ml Dexamethasone;Traction;Therapeutic exercise;Therapeutic activities;Neuromuscular re-education;Patient/family education;Manual techniques;Taping;Dry needling;Passive range of motion    PT Next Visit Plan d/c PT today    PT Home Exercise Plan Access Code: [email or web address]    Consulted and Agree with Plan of Care Patient           Patient will benefit from skilled therapeutic intervention in order to improve the following deficits and impairments:  Increased fascial restricitons,Increased muscle spasms,Pain,Impaired UE functional use,Decreased strength,Decreased range of motion,Postural dysfunction  Visit Diagnosis: Radiculopathy, cervicothoracic region  Other symptoms and signs involving the musculoskeletal system  Muscle weakness (generalized)     Problem List Patient Active Problem List   Diagnosis Date Noted  . MDD (major depressive disorder)  07/31/2019  . Major depressive disorder, recurrent episode, moderate ([place]) 04/27/2019  . MDD (major depressive disorder), recurrent severe, without psychosis ([place]) 04/04/2019  . ADHD, hyperactive-impulsive type 04/04/2019  . GAD (generalized anxiety disorder) 04/04/2019  . Chronic post-traumatic stress disorder (PTSD) 04/04/2019      Laureen Abrahams, PT, DPT 01/28/21 1:40 PM    [facility] [phone]  [place], [place], [address] Phone: [phone]   Fax:  [phone]  Name: Shelly Porter MRN: [id number] Date of Birth: 08/13/2001     PHYSICAL THERAPY DISCHARGE SUMMARY  Visits from Start of Care: 7  Current functional level related to goals / functional outcomes: See above   Remaining deficits: See above   Education / Equipment: HEP, DN  Plan: Patient agrees to discharge.  Patient goals were met. Patient is being discharged due to meeting the stated rehab goals.  ?????     Laureen Abrahams, PT, DPT 01/28/21 1:40 PM  [facility] [address] [place], [place], [address] Phone: [phone]   Fax:  [phone]

## 2021-01-30 ENCOUNTER — Encounter: Payer: 59 | Admitting: Physical Therapy

## 2022-03-10 ENCOUNTER — Other Ambulatory Visit: Payer: Self-pay | Admitting: Orthopedic Surgery

## 2022-03-10 DIAGNOSIS — M25512 Pain in left shoulder: Secondary | ICD-10-CM

## 2022-03-20 ENCOUNTER — Ambulatory Visit
Admission: RE | Admit: 2022-03-20 | Discharge: 2022-03-20 | Disposition: A | Discharge status: Home / Self Care | Payer: 59 | Source: Ambulatory Visit | Attending: Orthopedic Surgery | Admitting: Orthopedic Surgery

## 2022-03-20 DIAGNOSIS — M25512 Pain in left shoulder: Secondary | ICD-10-CM

## 2022-07-04 IMAGING — CT CT RENAL STONE PROTOCOL
2 of 4 series · 15 of 46 positions shown, 17 images · non-contrast
Comparison: CT 08/12/2020

CLINICAL DATA: Right flank pain.  Lithotripsy 1 day ago

EXAM:
CT ABDOMEN AND PELVIS WITHOUT CONTRAST
TECHNIQUE: Multidetector CT imaging of the abdomen and pelvis was performed
following the standard protocol without IV contrast.

[Series 2: axial st · axial · 0.59mm/px · z∈[+1020,+1350]mm · 12 of 74 slices shown, 14 images]
[im 4/74  soft-tissue]
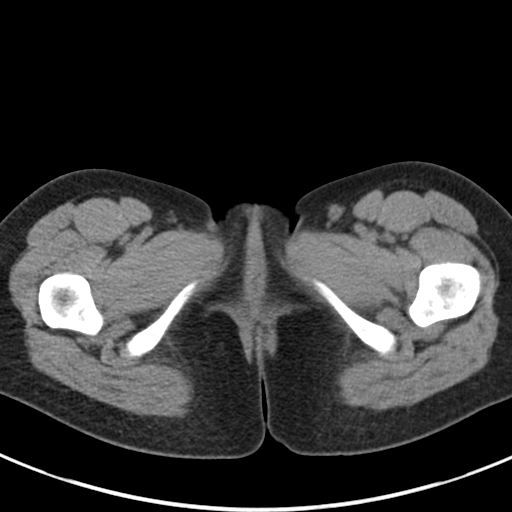
[im 4/74  bone]
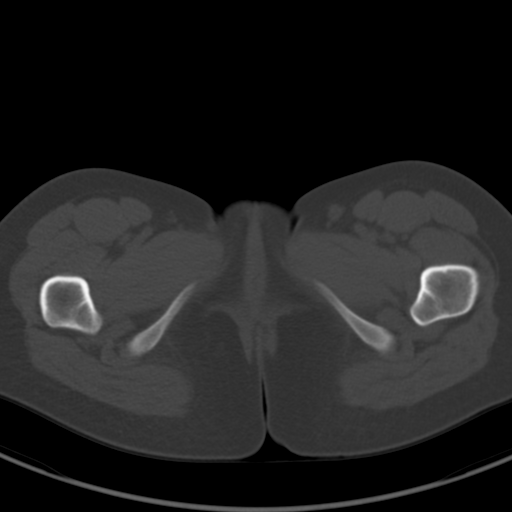
[im 10/74  soft-tissue]
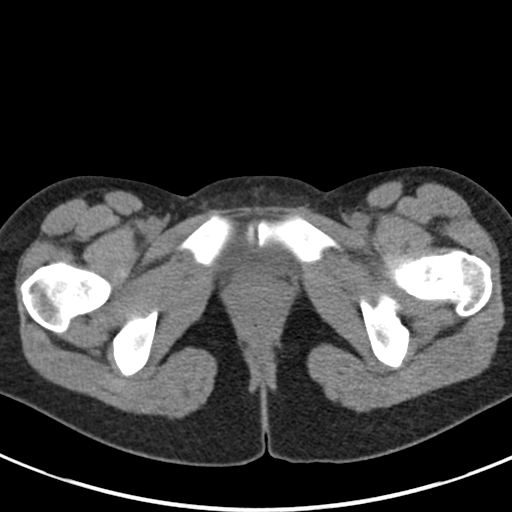
[im 16/74  soft-tissue]
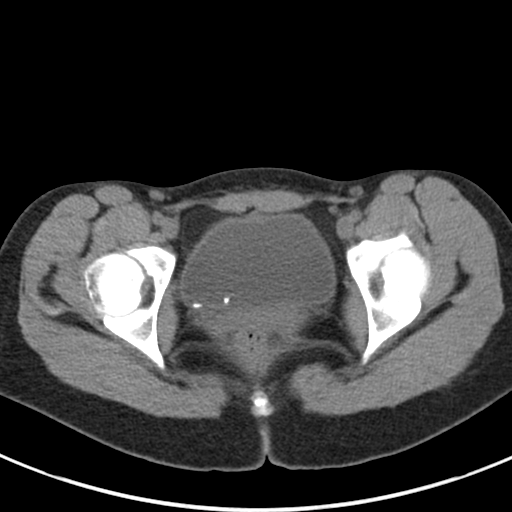
[im 23/74  soft-tissue]
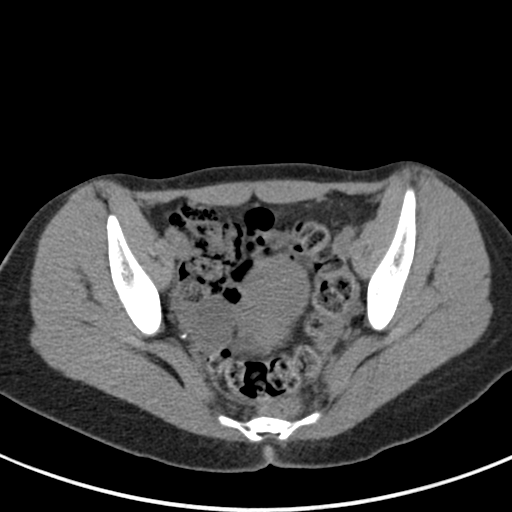
[im 29/74  soft-tissue]
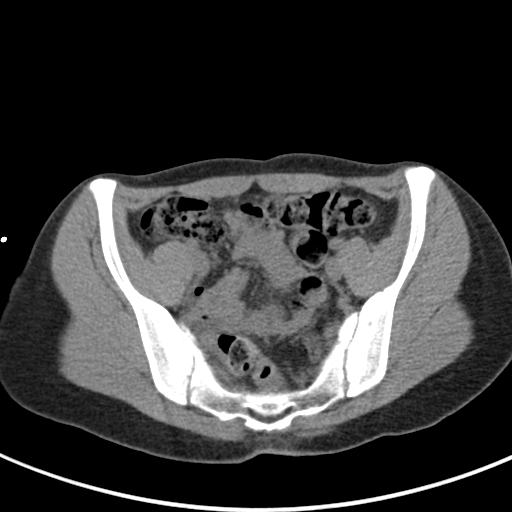
[im 35/74  soft-tissue]
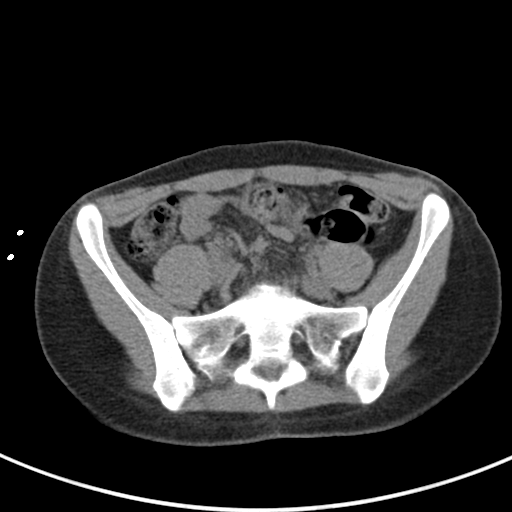
[im 39/74  soft-tissue]
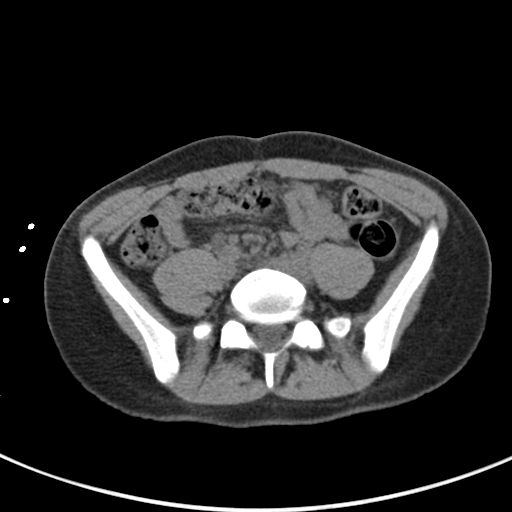
[im 45/74  soft-tissue]
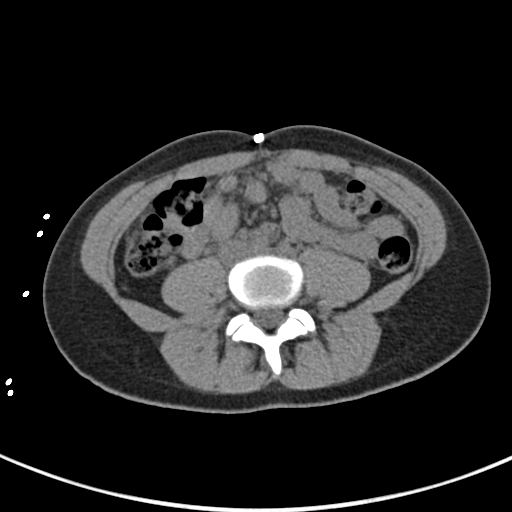
[im 51/74  soft-tissue]
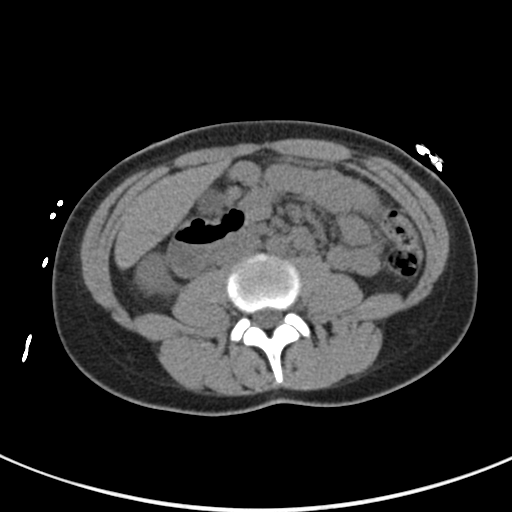
[im 51/74  bone]
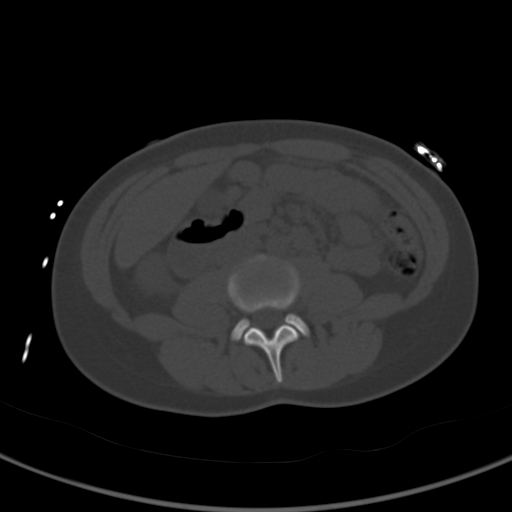
[im 58/74  soft-tissue]
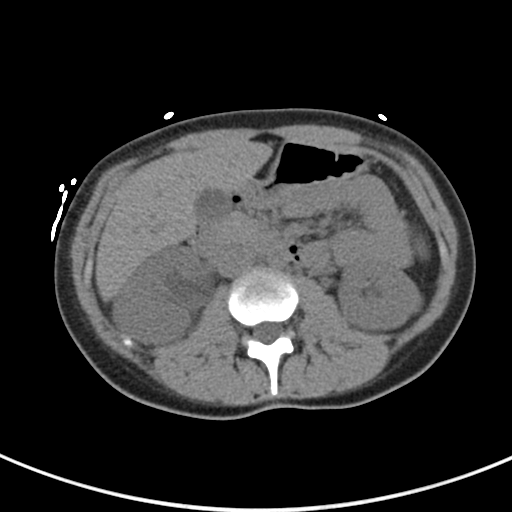
[im 64/74  soft-tissue]
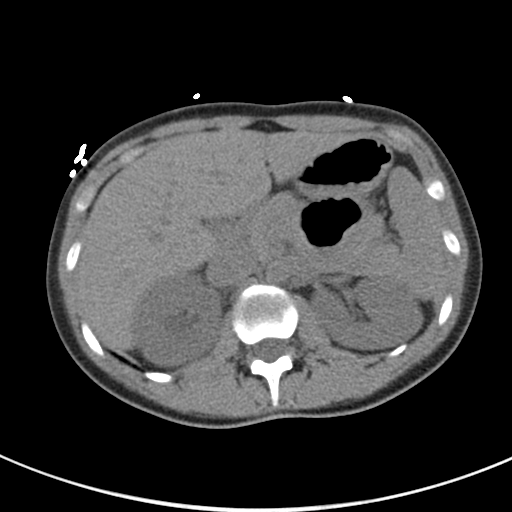
[im 70/74  soft-tissue]
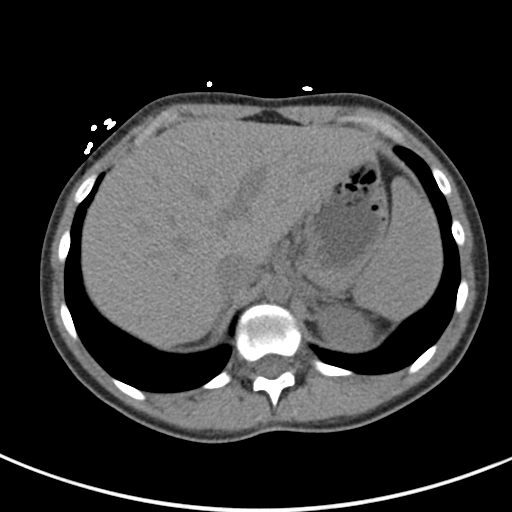

[Series 4: coronal · coronal · 0.57mm/px · 3 of 107 slices shown]
[im 36/107  soft-tissue]
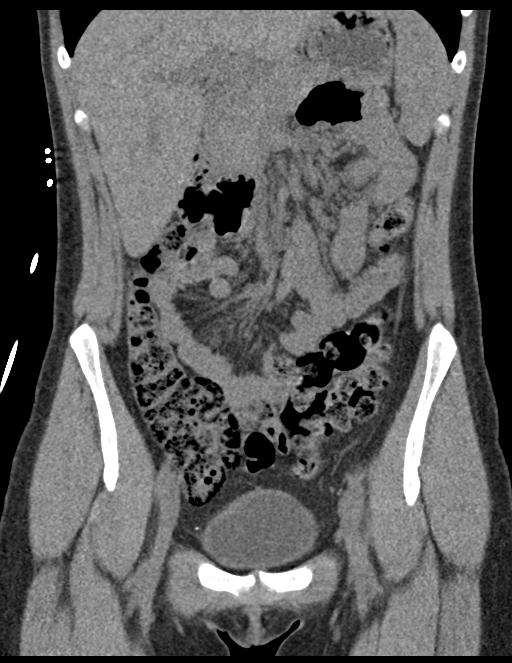
[im 48/107  soft-tissue]
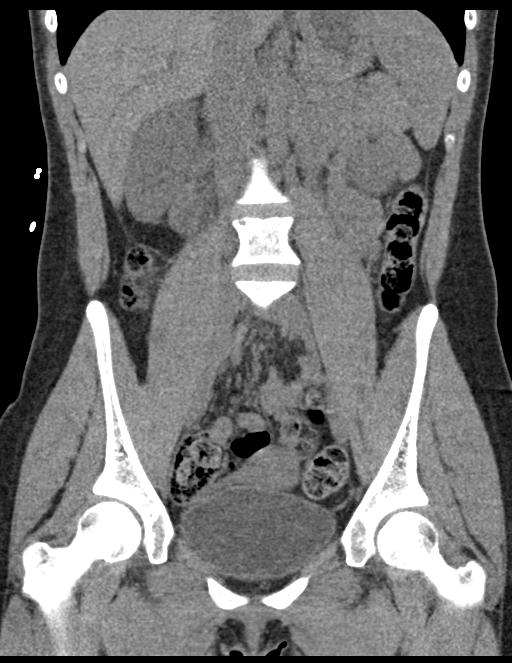
[im 59/107  soft-tissue]
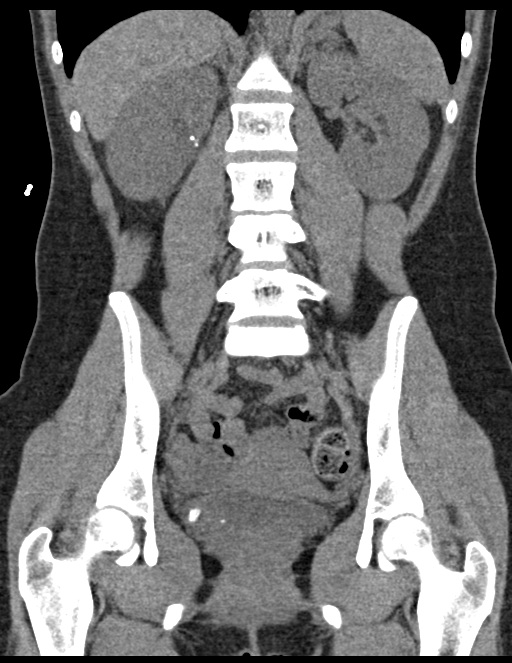

[15 of 46 positions shown; findings below may reference images not displayed]

FINDINGS: Lower chest: Visualized lung bases are clear.

Hepatobiliary: The hepatic dome was not entirely included within the
field of view. No focal liver lesion identified. Unremarkable
gallbladder. No hyperdense gallstone. No biliary dilatation.

Pancreas: Unremarkable.

Spleen: Unremarkable.

Adrenals/Urinary Tract: Fragmentation of the previously seen right
renal calculi with two punctate 2 mm stone fragments dependently
within the right renal pelvis (series 2, image 16) and a few
additional 1-2 mm stone fragments within the mid and inferior pole
of the right kidney. There are multiple 3-4 mm stone fragments
within the distal right ureter at the level of the pelvic sidewall
(series 4, image 68). At the right ureterovesical junction is a
stone or conglomerate of small stones measuring up to 7 mm (series
4, image 59; series 2, image 58). There is mild right
hydroureteronephrosis. No perinephric hematoma or fluid collection.

Left kidney and ureter are unremarkable without stone or
hydronephrosis. 3 mm punctate stone is present dependently within
the bladder lumen. Urinary bladder otherwise unremarkable. Adrenal
glands unremarkable.

Stomach/Bowel: The stomach, small bowel, colon, and appendix are
unremarkable.

Vascular/Lymphatic: No significant vascular findings are present. No
enlarged abdominal or pelvic lymph nodes.

Reproductive: Uterus and bilateral adnexa are unremarkable.

Other: No free fluid. No abdominopelvic fluid collection. No
pneumoperitoneum. No abdominal wall hernia.

Musculoskeletal: No acute or significant osseous findings. Limbus
vertebrae again noted at L4.
IMPRESSION: 1. Fragmentation of the previously seen right renal calculi with
multiple punctate 2 mm stone fragments dependently within the right
kidney and right renal pelvis. Multiple 3-4 mm stone fragments
within the distal right ureter at the level of the pelvic sidewall.
At the right ureterovesical junction is a stone or conglomerate of
small stones in total measuring up to 7 mm. There is mild right
hydroureteronephrosis.
2. No perinephric hematoma.
3. 3 mm punctate stone within the bladder lumen.

## 2022-12-23 ENCOUNTER — Ambulatory Visit (HOSPITAL_COMMUNITY)
Admission: EM | Admit: 2022-12-23 | Discharge: 2022-12-23 | Disposition: A | Discharge status: Home / Self Care | Payer: 59 | Attending: Internal Medicine | Admitting: Internal Medicine

## 2022-12-23 ENCOUNTER — Encounter (HOSPITAL_COMMUNITY): Payer: Self-pay

## 2022-12-23 DIAGNOSIS — J069 Acute upper respiratory infection, unspecified: Secondary | ICD-10-CM | POA: Diagnosis present

## 2022-12-23 DIAGNOSIS — N898 Other specified noninflammatory disorders of vagina: Secondary | ICD-10-CM | POA: Diagnosis present

## 2022-12-23 MED ORDER — BENZONATATE 100 MG PO CAPS: 100.0000 mg | ORAL_CAPSULE | Freq: Three times a day (TID) | ORAL | 0 refills | Status: DC

## 2022-12-23 NOTE — Discharge Instructions (Signed)
You have a viral upper respiratory infection.   Use the following medicines to help with symptoms: - Continue xyzal, you may find adding flonase will help with drainage as well. - Tylenol 1,000mg  and/or ibuprofen 600mg  every 6 hours with food as needed for aches/pains or fever/chills.  - Tessalon perles every 8 hours as needed for cough.  1 tablespoon of honey in warm water and/or salt water gargles may also help with symptoms. Humidifier to your room will help add water to the air and reduce coughing.  Apply aquaphor to the vaginal area of itching, this may help. Vaginal swab is pending and will come back in the next 2-3 days, we will call you if anything on the swab is positive and send in the treatment at that time.  If you develop any new or worsening symptoms, please return.  If your symptoms are severe, please go to the emergency room.  Follow-up with your primary care provider for further evaluation and management of your symptoms as well as ongoing wellness visits.  I hope you feel better!

## 2022-12-23 NOTE — ED Provider Notes (Signed)
[facility]    CSN: [id number] Arrival date & time: 12/23/22  1218      History   Chief Complaint Chief Complaint  Patient presents with   Cough   SEXUALLY TRANSMITTED DISEASE    HPI Shelly Porter is a 22 y.o. female.   Patient presents to urgent care for evaluation of cough, rhinorrhea, and sore throat that started 3 days ago. Sore throat is worse in the mornings leading patient to believe this is related to dry air overnight. History of tonsillectomy, denies difficulty swallowing, andChanges in voice sounds, and recent antibiotic or steroid use.  States she did perform oral intercourse to a new female sexual partner 2 weeks ago.  The new sexual partner was not experiencing any symptoms of sexually transmitted disease at the time.  Patient denies sores and ulcerations to the mouth.  No history of HSV-2 or HSV 1.  History of asthma and allergic rhinitis.  Currently takes Xyzal daily for allergy maintenance.  She has felt the need to use her albuterol inhaler and last used it this morning with relief of shortness of breath symptoms.  She is currently denying shortness of breath, chest pain, heart palpitations, nausea, vomiting, abdominal pain, body aches, fever/chills, and dizziness.  She is not a smoker and denies drug use.  She has been using over-the-counter medications for viral URI symptoms without relief.  She would also like to be evaluated for vaginal itching after recent intercourse with new sexual female partner.  She reports vaginal dryness as well without any vaginal discharge, odor, or rash.  Itching is to the inferior vaginal area and 1 singular localized spot.  She does report she shaved her vaginal area approximately 3 days ago and believes that she may have caused irritation to the skin surrounding the vaginal area.  She is not a diabetic and denies urinary symptoms.  Unsure of her last menstrual cycle, however she denies chance of pregnancy as she is sexually  active with females.    Cough   Past Medical History:  Diagnosis Date   ADHD (attention deficit hyperactivity disorder)    Anxiety    Asthma    Complication of anesthesia    emotional wiith Versed and " my body temp drops."    Family history of adverse reaction to anesthesia    "my mom has a low body temp with anesthesia and she gets nauseous too."    Headache    cluster and migraine   History of kidney stones    Serum sickness due to drug    Reaction with Doxycycline    Patient Active Problem List   Diagnosis Date Noted   MDD (major depressive disorder) 07/31/2019   Major depressive disorder, recurrent episode, moderate ([place]) 04/27/2019   MDD (major depressive disorder), recurrent severe, without psychosis ([place]) 04/04/2019   ADHD, hyperactive-impulsive type 04/04/2019   GAD (generalized anxiety disorder) 04/04/2019   Chronic post-traumatic stress disorder (PTSD) 04/04/2019    Past Surgical History:  Procedure Laterality Date   ADENOIDECTOMY     EXTRACORPOREAL SHOCK WAVE LITHOTRIPSY Right 08/15/2020   Procedure: RIGHT EXTRACORPOREAL SHOCK WAVE LITHOTRIPSY (ESWL);  Surgeon: Bjorn Loser, MD;  Location: [facility];  Service: Urology;  Laterality: Right;   EXTRACORPOREAL SHOCK WAVE LITHOTRIPSY  05/2017   KNEE ARTHROSCOPY W/ MENISCAL REPAIR     SHOULDER ARTHROSCOPY     Nov 2019   TONSILLECTOMY AND ADENOIDECTOMY     TYMPANOPLASTY     TYMPANOSTOMY TUBE PLACEMENT  OB History   No obstetric history on file.      Home Medications    Prior to Admission medications   Medication Sig Start Date End Date Taking? Authorizing Provider  benzonatate (TESSALON) 100 MG capsule Take 1 capsule (100 mg total) by mouth every 8 (eight) hours. 12/23/22  Yes Carlisle Beers, FNP  indapamide (LOZOL) 1.25 MG tablet Take 1 tablet by mouth daily. 03/26/21  Yes [provider]  pantoprazole (PROTONIX) 20 MG tablet Take 1 tablet by mouth daily. 02/21/21  Yes  [provider]  cephALEXin (KEFLEX) 500 MG capsule Take 1 capsule (500 mg total) by mouth 4 (four) times daily. Patient not taking: No sig reported 08/16/20   Liberty Handy, PA-C  escitalopram (LEXAPRO) 20 MG tablet Take 1 tablet by mouth daily.    [provider]  HYDROcodone-acetaminophen (NORCO/VICODIN) 5-325 MG tablet Take 1 tablet by mouth every 6 (six) hours as needed for moderate pain. Patient not taking: Reported on 12/30/2020    [provider]  HYDROmorphone (DILAUDID) 2 MG tablet Take by mouth every 4 (four) hours as needed for severe pain. Patient not taking: Reported on 12/30/2020    [provider]  meloxicam (MOBIC) 7.5 MG tablet meloxicam 7.5 mg tablet    [provider]  ondansetron (ZOFRAN ODT) 4 MG disintegrating tablet Take 1 tablet (4 mg total) by mouth every 8 (eight) hours as needed for nausea or vomiting. Patient not taking: Reported on 12/30/2020 08/16/20   Liberty Handy, PA-C  silodosin (RAPAFLO) 4 MG CAPS capsule Take 4 mg by mouth once. At bedtime Patient not taking: Reported on 12/30/2020    [provider]  Burr Medico 150-35 MCG/24HR transdermal patch Place 1 patch onto the skin every Sunday. For 3 weeks of the month 04/10/17   [provider]    Family History Family History  Problem Relation Age of Onset   Schizophrenia Paternal Uncle     Social History Social History   Tobacco Use   Smoking status: Never   Smokeless tobacco: Never  Vaping Use   Vaping Use: Never used  Substance Use Topics   Alcohol use: No    Alcohol/week: 0.0 standard drinks of alcohol   Drug use: No     Allergies   Doxycycline, Shellfish allergy, Clindamycin/lincomycin, Penicillins, and Sulfa antibiotics   Review of Systems Review of Systems  Respiratory:  Positive for cough.   Per HPI   Physical Exam Triage Vital Signs ED Triage Vitals [12/23/22 1304]  Enc Vitals Group     BP 120/82     Pulse Rate 76      Resp 18     Temp 98.2 F (36.8 C)     Temp Source Oral     SpO2 98 %     Weight      Height      Head Circumference      Peak Flow      Pain Score 0     Pain Loc      Pain Edu?      Excl. in GC?    No data found.  Updated Vital Signs BP 120/82 (BP Location: Left Arm)   Pulse 76   Temp 98.2 F (36.8 C) (Oral)   Resp 18   SpO2 98%   Visual Acuity Right Eye Distance:   Left Eye Distance:   Bilateral Distance:    Right Eye Near:   Left Eye Near:    Bilateral  Near:     Physical Exam Vitals and nursing note reviewed.  Constitutional:      Appearance: She is not ill-appearing or toxic-appearing.  HENT:     Head: Normocephalic and atraumatic.     Right Ear: Hearing, tympanic membrane, ear canal and external ear normal. There is impacted cerumen.     Left Ear: Hearing, tympanic membrane, ear canal and external ear normal. There is impacted cerumen.     Nose: Rhinorrhea present.     Mouth/Throat:     Lips: Pink.     Pharynx: No oropharyngeal exudate or posterior oropharyngeal erythema.     Comments: Status post tonsillectomy.  No erythema, swelling, or exudate to the posterior oropharynx. Eyes:     General: Lids are normal. Vision grossly intact. Gaze aligned appropriately.        Right eye: No discharge.        Left eye: No discharge.     Extraocular Movements: Extraocular movements intact.     Conjunctiva/sclera: Conjunctivae normal.  Cardiovascular:     Rate and Rhythm: Normal rate and regular rhythm.     Heart sounds: Normal heart sounds, S1 normal and S2 normal.  Pulmonary:     Effort: Pulmonary effort is normal. No respiratory distress.     Breath sounds: Normal breath sounds and air entry. No stridor. No wheezing, rhonchi or rales.  Chest:     Chest wall: No tenderness.  Genitourinary:    Comments: Deferred. Musculoskeletal:     Cervical back: Neck supple.  Lymphadenopathy:     Cervical: No cervical adenopathy.  Skin:    General: Skin is warm and  dry.     Capillary Refill: Capillary refill takes less than 2 seconds.     Findings: No rash.  Neurological:     General: No focal deficit present.     Mental Status: She is alert and oriented to person, place, and time. Mental status is at baseline.     Cranial Nerves: No dysarthria or facial asymmetry.     Motor: No weakness.     Gait: Gait normal.  Psychiatric:        Mood and Affect: Mood normal.        Speech: Speech normal.        Behavior: Behavior normal.        Thought Content: Thought content normal.        Judgment: Judgment normal.      UC Treatments / Results  Labs (all labs ordered are listed, but only abnormal results are displayed) Labs Reviewed  CERVICOVAGINAL ANCILLARY ONLY    EKG   Radiology No results found.  Procedures Procedures (including critical care time)  Medications Ordered in UC Medications - No data to display  Initial Impression / Assessment and Plan / UC Course  I have reviewed the triage vital signs and the nursing notes.  Pertinent labs & imaging results that were available during my care of the patient were reviewed by me and considered in my medical decision making (see chart for details).   1.  Viral URI with cough Presentation is consistent with acute viral URI with cough that will likely improve in the next few days with as needed use of medications for symptomatic relief.  Deferred imaging based on stable cardiopulmonary exam and hemodynamically stable vital signs.  Tolerating food and fluids well without difficulty.  Does not meet [facility] criteria for strep testing, low suspicion for STI to the oropharynx.  Deferred viral  testing due to low suspicion for COVID-19 or influenza as she is afebrile and well-appearing.  May use over-the-counter medications to help with symptomatic relief.  Tessalon Perles sent to pharmacy to be used every 8 hours as directed for cough.  2.  Vaginal itching STD testing is pending, will come back in the  next 2 to 3 days.  Will call patient with results if they change treatment plan.  Low suspicion for yeast vaginitis, therefore will defer treatment until STD testing comes back.  Suspect patient recently injured the skin around the vaginal area causing irritation to the area.  May apply Aquaphor to localized area of itching to provide moisture and promote skin healing.  Advised to use plain soap and water to clean the area.  She is agreeable with this plan.   Discussed physical exam and available lab work findings in clinic with patient.  Counseled patient regarding appropriate use of medications and potential side effects for all medications recommended or prescribed today. Discussed red flag signs and symptoms of worsening condition,when to call the PCP office, return to urgent care, and when to seek higher level of care in the emergency department. Patient verbalizes understanding and agreement with plan. All questions answered. Patient discharged in stable condition.    Final Clinical Impressions(s) / UC Diagnoses   Final diagnoses:  Viral URI with cough  Vaginal itching     Discharge Instructions      You have a viral upper respiratory infection.   Use the following medicines to help with symptoms: - Continue xyzal, you may find adding flonase will help with drainage as well. - Tylenol 1,000mg  and/or ibuprofen 600mg  every 6 hours with food as needed for aches/pains or fever/chills.  - Tessalon perles every 8 hours as needed for cough.  1 tablespoon of honey in warm water and/or salt water gargles may also help with symptoms. Humidifier to your room will help add water to the air and reduce coughing.  Apply aquaphor to the vaginal area of itching, this may help. Vaginal swab is pending and will come back in the next 2-3 days, we will call you if anything on the swab is positive and send in the treatment at that time.  If you develop any new or worsening symptoms, please return.  If  your symptoms are severe, please go to the emergency room.  Follow-up with your primary care provider for further evaluation and management of your symptoms as well as ongoing wellness visits.  I hope you feel better!    ED Prescriptions     Medication Sig Dispense Auth. Provider   benzonatate (TESSALON) 100 MG capsule Take 1 capsule (100 mg total) by mouth every 8 (eight) hours. 21 capsule Talbot Grumbling, FNP      PDMP not reviewed this encounter.   Talbot Grumbling, Alicia 12/23/22 [phone]

## 2022-12-23 NOTE — ED Triage Notes (Signed)
Pt c/o cough, fatigue, weakness, and sore throat x3 days and needs a work note.  Pt c/o vaginal itching and sore throat x3 days after having sexually intercourse with a female. Requesting STD testing.

## 2022-12-24 ENCOUNTER — Telehealth (HOSPITAL_COMMUNITY): Payer: Self-pay | Admitting: Emergency Medicine

## 2022-12-24 LAB — CERVICOVAGINAL ANCILLARY ONLY
Bacterial Vaginitis (gardnerella): NEGATIVE
Candida Glabrata: NEGATIVE
Candida Vaginitis: POSITIVE — AB
Chlamydia: NEGATIVE
Comment: NEGATIVE
Comment: NEGATIVE
Comment: NEGATIVE
Comment: NEGATIVE
Comment: NEGATIVE
Comment: NORMAL
Neisseria Gonorrhea: NEGATIVE
Trichomonas: NEGATIVE

## 2022-12-24 MED ORDER — FLUCONAZOLE 150 MG PO TABS: 150.0000 mg | ORAL_TABLET | Freq: Once | ORAL | 0 refills | Status: AC

## 2023-02-13 ENCOUNTER — Ambulatory Visit
Admission: EM | Admit: 2023-02-13 | Discharge: 2023-02-13 | Disposition: A | Discharge status: Home / Self Care | Payer: 59

## 2023-02-13 DIAGNOSIS — T50905A Adverse effect of unspecified drugs, medicaments and biological substances, initial encounter: Secondary | ICD-10-CM | POA: Diagnosis not present

## 2023-02-13 DIAGNOSIS — Z872 Personal history of diseases of the skin and subcutaneous tissue: Secondary | ICD-10-CM | POA: Diagnosis not present

## 2023-02-13 DIAGNOSIS — L509 Urticaria, unspecified: Secondary | ICD-10-CM

## 2023-02-13 MED ORDER — METHYLPREDNISOLONE ACETATE 40 MG/ML IJ SUSP
40.0000 mg | Freq: Once | INTRAMUSCULAR | Status: AC
  Administered 2023-02-13: 40 mg via INTRAMUSCULAR

## 2023-02-13 MED ORDER — PREDNISONE 20 MG PO TABS: ORAL_TABLET | ORAL | 0 refills | Status: DC

## 2023-02-13 NOTE — ED Triage Notes (Signed)
Pt c/o scattered hives x 2 days-started 9 days after starting new med/lamictal-taking benadryl, allergra, xyzal-last benadryl at 7am-NAD-steady gait

## 2023-02-13 NOTE — ED Provider Notes (Signed)
[name]  Note:  This document was prepared using Systems analyst and may include unintentional dictation errors.  MRN: [id number] DOB: 02-25-2001  Subjective:   Shelly Porter is a 22 y.o. female presenting for persistent urticaria for the past 9 days since starting Lamictal. Has been using benadryl, Xyzal and Allegra without resolution. No chest pain, n/v, shortness of breath. Has a history of medication reactions. No oral swelling, sensation of throat closing. Feels slight chest tightness however. Has a history of Stevens-Johnson syndrome.  Previously when patient has had medication reaction she requires extended courses of steroids including injectable.  She has already contacted her mental health provider for follow-up.  No current facility-administered medications for this encounter.  Current Outpatient Medications:    benzonatate (TESSALON) 100 MG capsule, Take 1 capsule (100 mg total) by mouth every 8 (eight) hours., Disp: 21 capsule, Rfl: 0   cephALEXin (KEFLEX) 500 MG capsule, Take 1 capsule (500 mg total) by mouth 4 (four) times daily. (Patient not taking: Reported on 08/19/2020), Disp: 20 capsule, Rfl: 0   escitalopram (LEXAPRO) 20 MG tablet, Take 1 tablet by mouth daily., Disp: , Rfl:    HYDROcodone-acetaminophen (NORCO/VICODIN) 5-325 MG tablet, Take 1 tablet by mouth every 6 (six) hours as needed for moderate pain. (Patient not taking: Reported on 12/30/2020), Disp: , Rfl:    HYDROmorphone (DILAUDID) 2 MG tablet, Take by mouth every 4 (four) hours as needed for severe pain. (Patient not taking: Reported on 12/30/2020), Disp: , Rfl:    indapamide (LOZOL) 1.25 MG tablet, Take 1 tablet by mouth daily., Disp: , Rfl:    meloxicam (MOBIC) 7.5 MG tablet, meloxicam 7.5 mg tablet, Disp: , Rfl:    ondansetron (ZOFRAN ODT) 4 MG disintegrating tablet, Take 1 tablet (4 mg total) by mouth every 8 (eight) hours as needed for nausea or vomiting.  (Patient not taking: Reported on 12/30/2020), Disp: 20 tablet, Rfl: 0   pantoprazole (PROTONIX) 20 MG tablet, Take 1 tablet by mouth daily., Disp: , Rfl:    prazosin (MINIPRESS) 1 MG capsule, Take 1 mg by mouth at bedtime., Disp: , Rfl:    pregabalin (LYRICA) 75 MG capsule, Take 1 capsule by mouth 2 (two) times daily., Disp: , Rfl:    silodosin (RAPAFLO) 4 MG CAPS capsule, Take 4 mg by mouth once. At bedtime (Patient not taking: Reported on 12/30/2020), Disp: , Rfl:    XULANE 150-35 MCG/24HR transdermal patch, Place 1 patch onto the skin every Sunday. For 3 weeks of the month, Disp: , Rfl: 5   Allergies  Allergen Reactions   Doxycycline Other (See Comments)    Serum sickness Advised to stay away from all 'cyclines'    Shellfish Allergy Itching   Clindamycin/Lincomycin Other (See Comments)    Unknown   Penicillins Other (See Comments)    Unknown   Sulfa Antibiotics Other (See Comments)    Unknown    Past Medical History:  Diagnosis Date   ADHD (attention deficit hyperactivity disorder)    Anxiety    Asthma    Complication of anesthesia    emotional wiith Versed and " my body temp drops."    Family history of adverse reaction to anesthesia    "my mom has a low body temp with anesthesia and she gets nauseous too."    Headache    cluster and migraine   History of kidney stones    Serum sickness due to drug    Reaction with  Doxycycline     Past Surgical History:  Procedure Laterality Date   ADENOIDECTOMY     EXTRACORPOREAL SHOCK WAVE LITHOTRIPSY Right 08/15/2020   Procedure: RIGHT EXTRACORPOREAL SHOCK WAVE LITHOTRIPSY (ESWL);  Surgeon: Bjorn Loser, MD;  Location[facility];  Service: Urology;  Laterality: Right;   EXTRACORPOREAL SHOCK WAVE LITHOTRIPSY  05/2017   KNEE ARTHROSCOPY W/ MENISCAL REPAIR     SHOULDER ARTHROSCOPY     Nov 2019   SHOULDER SURGERY     TONSILLECTOMY AND ADENOIDECTOMY     TYMPANOPLASTY     TYMPANOSTOMY TUBE PLACEMENT      Family  History  Problem Relation Age of Onset   Schizophrenia Paternal Uncle     Social History   Tobacco Use   Smoking status: Former    Types: Cigarettes   Smokeless tobacco: Never  Vaping Use   Vaping Use: Former  Substance Use Topics   Alcohol use: No    Alcohol/week: 0.0 standard drinks of alcohol   Drug use: No    ROS   Objective:   Vitals: BP 117/80 (BP Location: Right Arm)   Pulse 79   Temp 98 F (36.7 C) (Oral)   Resp 16   SpO2 99%   Physical Exam Constitutional:      General: She is not in acute distress.    Appearance: Normal appearance. She is well-developed. She is not ill-appearing, toxic-appearing or diaphoretic.  HENT:     Head: Normocephalic and atraumatic.     Comments: No facial or oral swelling.    Right Ear: External ear normal.     Left Ear: External ear normal.     Nose: Nose normal.     Mouth/Throat:     Mouth: Mucous membranes are moist.     Pharynx: No pharyngeal swelling, oropharyngeal exudate, posterior oropharyngeal erythema or uvula swelling.     Tonsils: No tonsillar exudate or tonsillar abscesses. 0 on the right. 0 on the left.     Comments: Airway is patent.  Patient is controlling secretions. Eyes:     General: No scleral icterus.       Right eye: No discharge.        Left eye: No discharge.     Extraocular Movements: Extraocular movements intact.  Cardiovascular:     Rate and Rhythm: Normal rate and regular rhythm.     Heart sounds: Normal heart sounds. No murmur heard.    No friction rub. No gallop.  Pulmonary:     Effort: Pulmonary effort is normal. No respiratory distress.     Breath sounds: No stridor. No wheezing, rhonchi or rales.  Chest:     Chest wall: No tenderness.  Skin:    General: Skin is warm and dry.     Findings: Rash (diffuse urticaria throughout worse over the torso, proximal extremities; also has urticarial lesions to a lesser extent over the face) present.  Neurological:     General: No focal deficit  present.     Mental Status: She is alert and oriented to person, place, and time.  Psychiatric:        Mood and Affect: Mood normal.        Behavior: Behavior normal.        Thought Content: Thought content normal.        Judgment: Judgment normal.    IM Depo-Medrol 40 mg administered in clinic.  Assessment and Plan :   PDMP not reviewed this encounter.  1. Diffuse urticaria  2. Medication reaction, initial encounter   3. History of Stevens-Johnson toxic epidermal necrolysis overlap syndrome     I will be aggressive in my approach with steroids including the injection as above and a 10-day course of prednisone given her medical history.  Patient is to continue using over-the-counter antihistamines.  Follow-up with mental health provider soon as possible.  Maintain strict ER precautions. Counseled patient on potential for adverse effects with medications prescribed/recommended today, ER and return-to-clinic precautions discussed, patient verbalized understanding.    Jaynee Eagles, [place] 02/13/23 [phone]

## 2024-02-05 IMAGING — CT CT SHOULDER*L* W/O CM
3 of 4 series · 13 of 35 positions shown, 16 images · non-contrast
Comparison: 03/11/2022

CLINICAL DATA: Left shoulder pain.  Recent dislocation.

EXAM:
CT OF THE UPPER LEFT EXTREMITY WITHOUT CONTRAST
TECHNIQUE: Multidetector CT imaging of the upper left extremity was performed
according to the standard protocol.
RADIATION DOSE REDUCTION: This exam was performed according to the
departmental dose-optimization program which includes automated
exposure control, adjustment of the mA and/or kV according to
patient size and/or use of iterative reconstruction technique.

[Series 6: shoulder 2.00 br40 s3 cor soft · coronal · 0.39mm/px · 3 of 59 slices shown]
[im 12/59  bone]
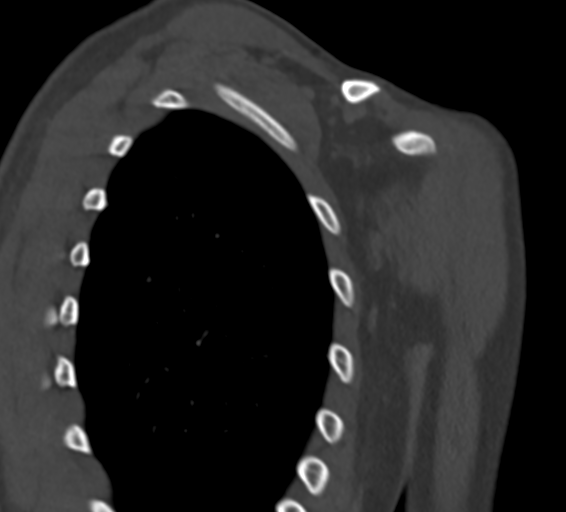
[im 24/59  bone]
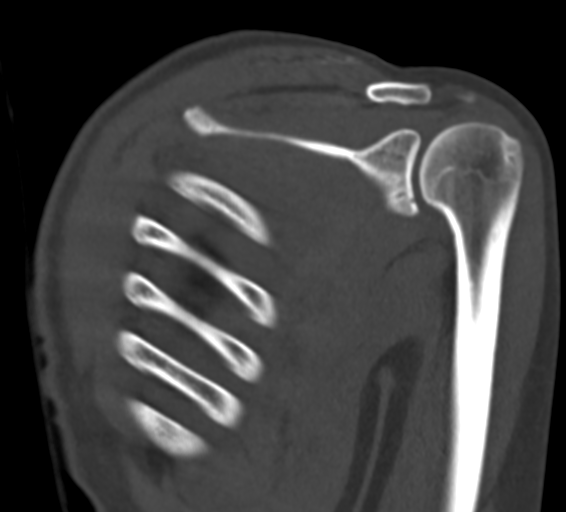
[im 35/59  bone]
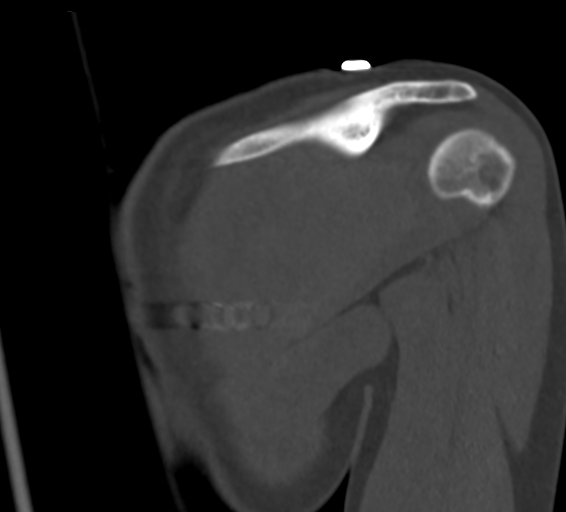

[Series 10: shoulder 2.00 br40 s3 sag soft · sagittal · 0.24mm/px · 5 of 115 slices shown, 6 images]
[im 39/115  bone]
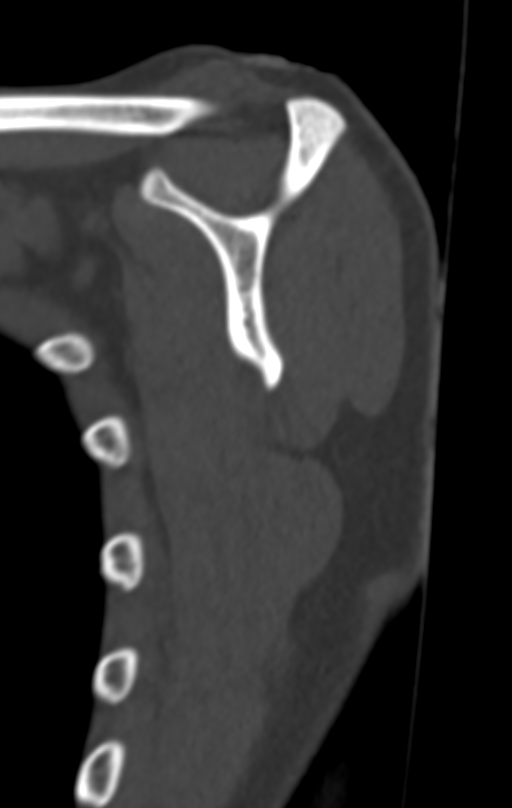
[im 48/115  bone]
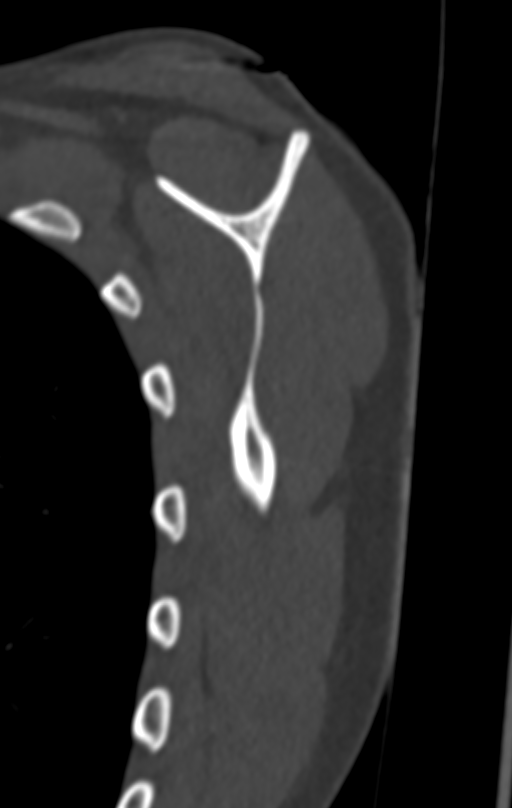
[im 58/115  soft-tissue]
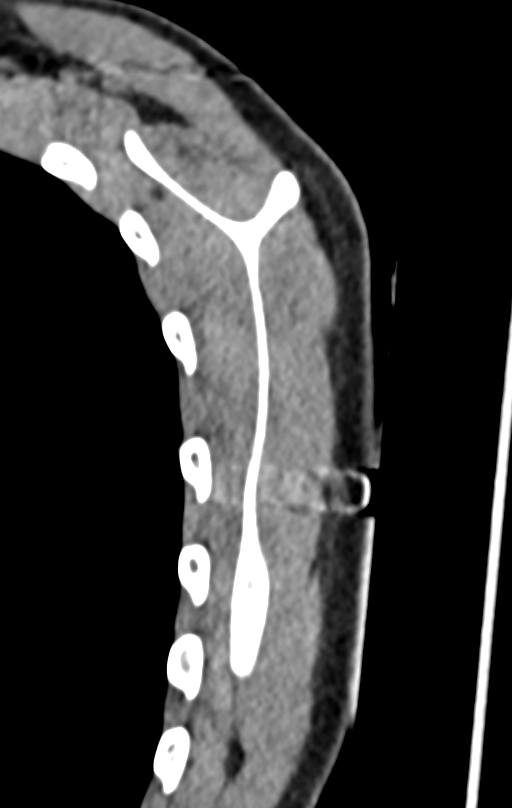
[im 58/115  bone]
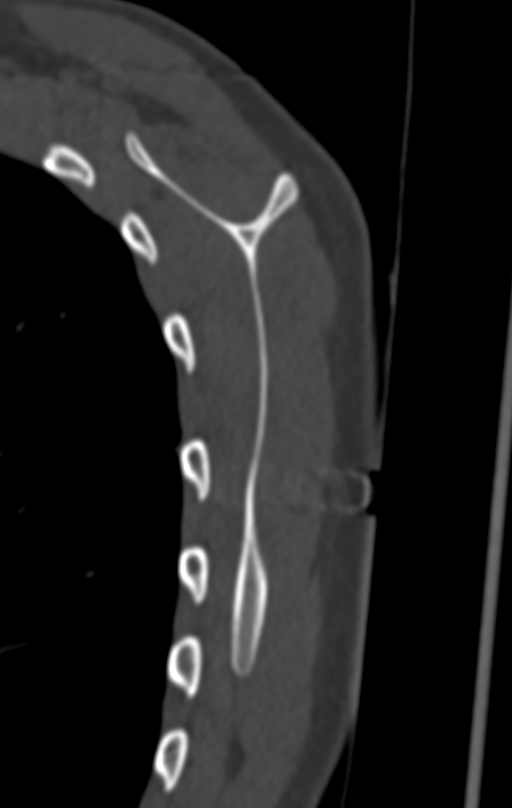
[im 67/115  bone]
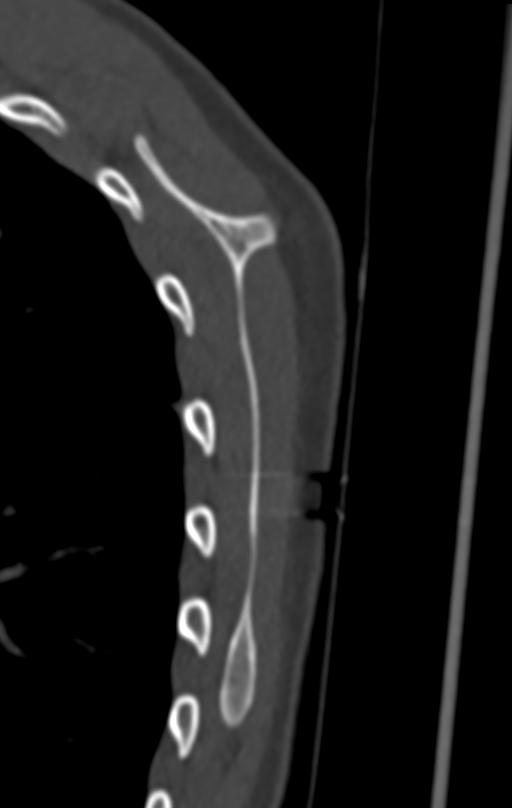
[im 77/115  bone]
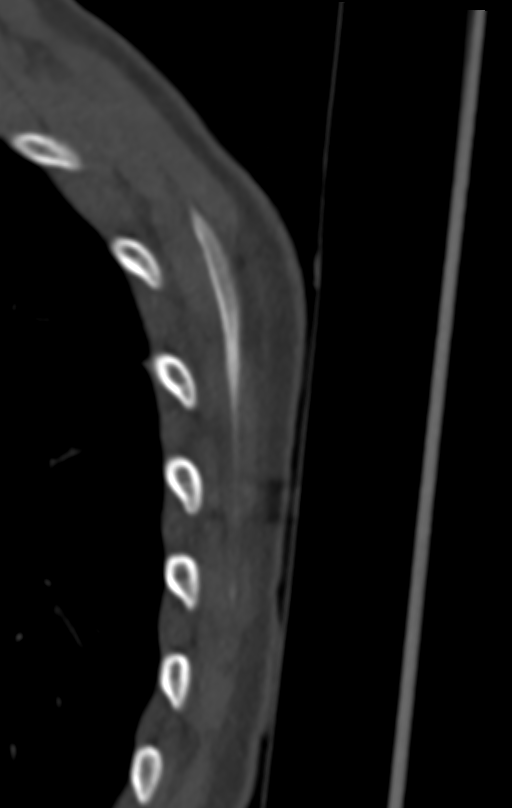

[Series 14: shoulder 0.60 br40 s3 thins soft · axial · 0.44mm/px · z∈[-1008,-850]mm · 5 of 332 slices shown, 7 images]
[im 34/332  soft-tissue]
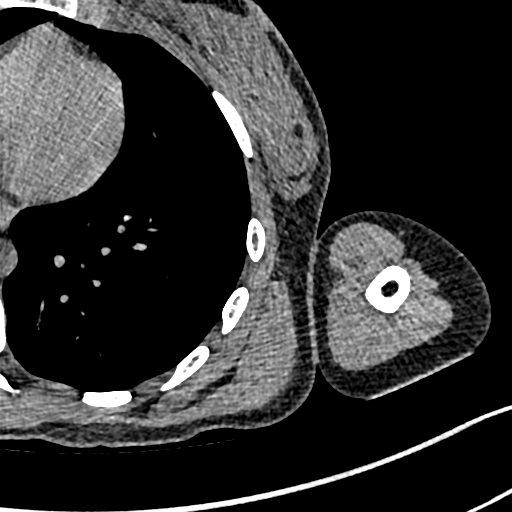
[im 34/332  bone]
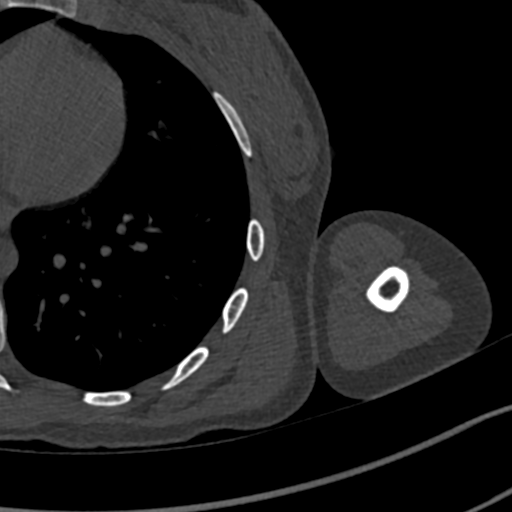
[im 100/332  bone]
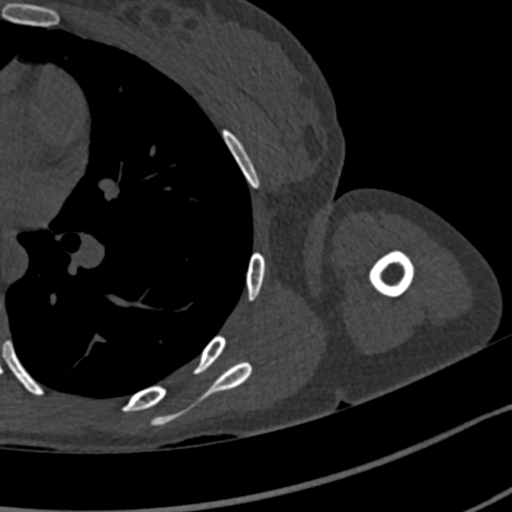
[im 166/332  bone]
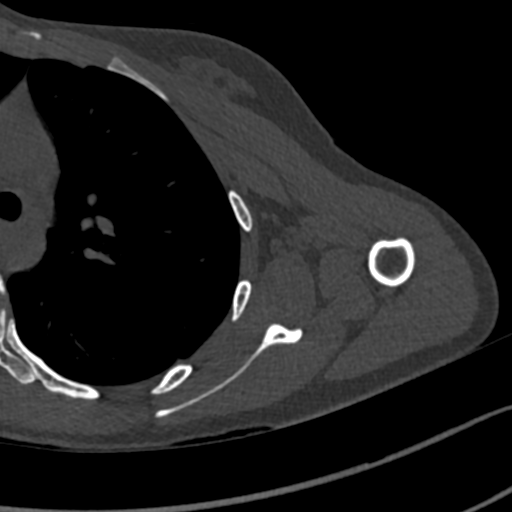
[im 232/332  bone]
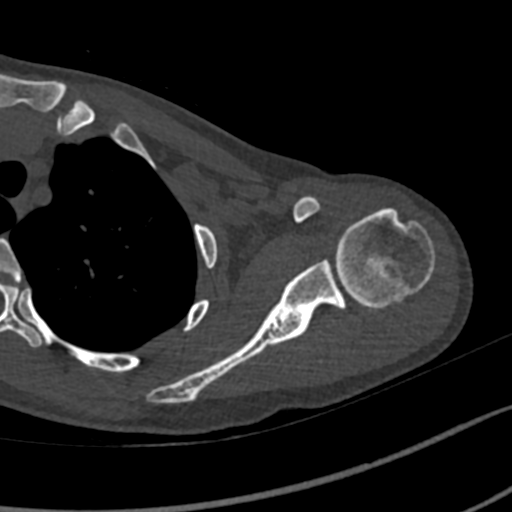
[im 298/332  soft-tissue]
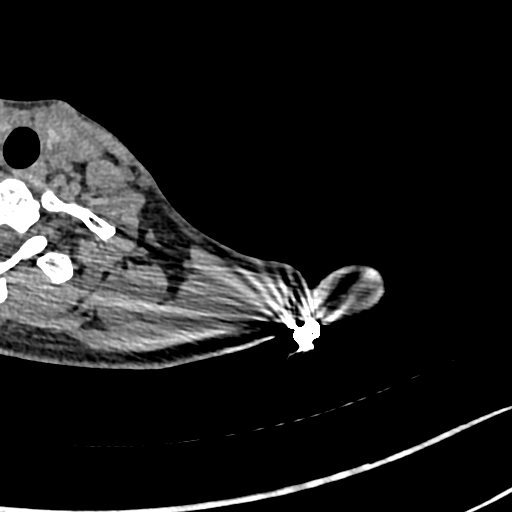
[im 298/332  bone]
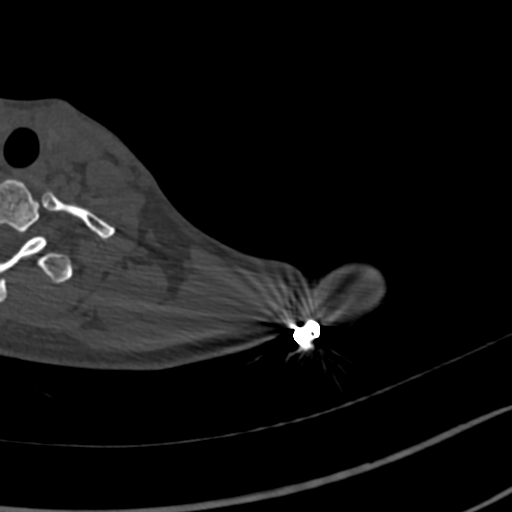

[13 of 35 positions shown; findings below may reference images not displayed]

FINDINGS: Bones/Joint/Cartilage

No acute fracture or dislocation. Normal alignment. No joint
effusion.

Prior glenoid repair along the anterior inferior aspect. Slight
cortical irregularity of the superior posterolateral humeral head
consistent with Hill-Sachs lesion similar to the prior MRI dated
03/11/2012. Glenohumeral joint space is maintained. Normal
acromioclavicular joint.

Ligaments

Ligaments are suboptimally evaluated by CT.

Muscles and Tendons
Muscles are normal. No muscle atrophy. No intramuscular fluid
collection or hematoma.

Soft tissue
No fluid collection or hematoma.  No soft tissue mass.
IMPRESSION: 1. No acute fracture or dislocation of the left shoulder.
2. Prior glenoid repair along the anterior inferior aspect without
evidence of an acute glenoid fracture.
3. Slight cortical irregularity of the superior posterolateral
humeral head consistent with Hill-Sachs lesion similar to the prior
MRI dated 03/11/2022.

## 2024-09-05 ENCOUNTER — Other Ambulatory Visit: Payer: Self-pay | Admitting: Urology

## 2024-09-13 NOTE — Patient Instructions (Signed)
 SURGICAL WAITING ROOM VISITATION  Patients having surgery or a procedure may have no more than 2 support people in the waiting area - these visitors may rotate.    Children under the age of 51 must have an adult with them who is not the patient.  Visitors with respiratory illnesses are discouraged from visiting and should remain at home.  If the patient needs to stay at the hospital during part of their recovery, the visitor guidelines for inpatient rooms apply. Pre-op nurse will coordinate an appropriate time for 1 support person to accompany patient in pre-op.  This support person may not rotate.    Please refer to the [facility] website for the visitor guidelines for Inpatients (after your surgery is over and you are in a regular room).       Your procedure is scheduled on: 09/19/24   Report to [facility] Main Entrance    Report to admitting at 1 PM   Call this number if you have problems the morning of surgery [phone]   Do not eat food or drink liquids :After Midnight.but may have sips of water with meds.      Oral Hygiene is also important to reduce your risk of infection.                                    Remember - BRUSH YOUR TEETH THE MORNING OF SURGERY WITH YOUR REGULAR TOOTHPASTE   Do NOT smoke after Midnight   Stop all vitamins and herbal supplements 7 days before surgery.   Take these medicines the morning of surgery with A SIP OF WATER: tylenol , Rexulti(brexpiprazole), Lexapro (escitalopram ), levocetirizine(Xyzal), Pantoprazole , Zofran              You may not have any metal on your body including hair pins, jewelry, and body piercing             Do not wear make-up, lotions, powders, perfumes/cologne, or deodorant  Do not wear nail polish including gel and S&S, artificial/acrylic nails, or any other type of covering on natural nails including finger and toenails. If you have artificial nails, gel coating, etc. that needs to be removed by a nail salon  please have this removed prior to surgery or surgery may need to be canceled/ delayed if the surgeon/ anesthesia feels like they are unable to be safely monitored.   Do not shave  48 hours prior to surgery.              Do not bring valuables to the hospital. [facility] IS NOT             RESPONSIBLE   FOR VALUABLES.   Contacts, glasses, dentures or bridgework may not be worn into surgery.  DO NOT BRING YOUR HOME MEDICATIONS TO THE HOSPITAL. PHARMACY WILL DISPENSE MEDICATIONS LISTED ON YOUR MEDICATION LIST TO YOU DURING YOUR ADMISSION IN THE HOSPITAL!    Patients discharged on the day of surgery will not be allowed to drive home.  Someone NEEDS to stay with you for the first 24 hours after anesthesia.   Special Instructions: Bring a copy of your healthcare power of attorney and living will documents the day of surgery if you haven't scanned them before.              Please read over the following fact sheets you were given: IF YOU HAVE QUESTIONS ABOUT YOUR PRE-OP INSTRUCTIONS  PLEASE CALL [phone] [place]   If you received a COVID test during your pre-op visit  it is requested that you wear a mask when out in public, stay away from anyone that may not be feeling well and notify your surgeon if you develop symptoms. If you test positive for Covid or have been in contact with anyone that has tested positive in the last 10 days please notify you surgeon.    [place] - Preparing for Surgery Before surgery, you can play an important role.  Because skin is not sterile, your skin needs to be as free of germs as possible.  You can reduce the number of germs on your skin by washing with CHG (chlorahexidine gluconate) soap before surgery.  CHG is an antiseptic cleaner which kills germs and bonds with the skin to continue killing germs even after washing. Please DO NOT use if you have an allergy to CHG or antibacterial soaps.  If your skin becomes reddened/irritated stop using the CHG and inform  your nurse when you arrive at Short Stay. Do not shave (including legs and underarms) for at least 48 hours prior to the first CHG shower.  You may shave your face/neck.  Please follow these instructions carefully:  1.  Shower with CHG Soap the night before surgery and the morning of surgery.  2.  If you choose to wash your hair, wash your hair first as usual with your normal  shampoo.  3.  After you shampoo, rinse your hair and body thoroughly to remove the shampoo.                             4.  Use CHG as you would any other liquid soap.  You can apply chg directly to the skin and wash.  Gently with a scrungie or clean washcloth.  5.  Apply the CHG Soap to your body ONLY FROM THE NECK DOWN.   Do   not use on face/ open                           Wound or open sores. Avoid contact with eyes, ears mouth and   genitals (private parts).                       Wash face,  Genitals (private parts) with your normal soap.             6.  Wash thoroughly, paying special attention to the area where your    surgery  will be performed.  7.  Thoroughly rinse your body with warm water from the neck down.  8.  DO NOT shower/wash with your normal soap after using and rinsing off the CHG Soap.                9.  Pat yourself dry with a clean towel.            10.  Wear clean pajamas.            11.  Place clean sheets on your bed the night of your first shower and do not  sleep with pets. Day of Surgery : Do not apply any CHG, lotions/deodorants the morning of surgery.  Please wear clean clothes to the hospital/surgery center.  FAILURE TO FOLLOW THESE INSTRUCTIONS MAY RESULT IN THE CANCELLATION OF YOUR SURGERY  PATIENT SIGNATURE_________________________________  NURSE SIGNATURE__________________________________  ________________________________________________________________________

## 2024-09-13 NOTE — Progress Notes (Signed)
 COVID Vaccine received:  []  No [x]  Yes [place] of any COVID positive Test in last 90 days:  PCP - Laurita Ano MD Cardiologist -   Chest x-ray -  EKG -   Stress Test -  ECHO -  Cardiac Cath -   Bowel Prep - [x]  No  []   Yes ______  Pacemaker / ICD device [x]  No []  Yes   Spinal Cord Stimulator:[x]  No []  Yes       History of Sleep Apnea? [x]  No []  Yes   CPAP used?- [x]  No []  Yes    Does the patient monitor blood sugar?          [x]  No []  Yes  []  N/A  Patient has: [x]  NO Hx DM   []  Pre-DM                 []  DM1  []   DM2 Does patient have a [employer] or Dexacom? []  No []  Yes   Fasting Blood Sugar Ranges-  Checks Blood Sugar _____ times a day  GLP1 agonist / usual dose - no GLP1 instructions:  SGLT-2 inhibitors / usual dose - no SGLT-2 instructions:   Blood Thinner / Instructions:no Aspirin Instructions:no  Comments:   Activity level: Patient is able  to climb a flight of stairs without difficulty; []  No CP  []  No SOB,   Patient can perform ADLs without assistance.   Anesthesia review: Pt. Stated her body temp dropped with anesthesia as a child  Patient denies shortness of breath, fever, cough and chest pain at PAT appointment.  Patient verbalized understanding and agreement to the Pre-Surgical Instructions that were given to them at this PAT appointment. Patient was also educated of the need to review these PAT instructions again prior to his/her surgery.I reviewed the appropriate phone numbers to call if they have any and questions or concerns.

## 2024-09-15 ENCOUNTER — Encounter (HOSPITAL_COMMUNITY): Payer: Self-pay | Admitting: *Deleted

## 2024-09-15 ENCOUNTER — Encounter (HOSPITAL_COMMUNITY)
Admission: RE | Admit: 2024-09-15 | Discharge: 2024-09-15 | Disposition: A | Discharge status: Home / Self Care | Source: Ambulatory Visit | Attending: Urology | Admitting: Urology

## 2024-09-15 ENCOUNTER — Other Ambulatory Visit: Payer: Self-pay

## 2024-09-15 VITALS — BP 122/91 | HR 85 | Temp 98.3°F | Resp 16 | Ht 62.0 in | Wt 180.0 lb

## 2024-09-15 DIAGNOSIS — Z01818 Encounter for other preprocedural examination: Secondary | ICD-10-CM | POA: Diagnosis present

## 2024-09-15 DIAGNOSIS — Z01812 Encounter for preprocedural laboratory examination: Secondary | ICD-10-CM | POA: Insufficient documentation

## 2024-09-15 HISTORY — DX: Anemia, unspecified: D64.9

## 2024-09-15 HISTORY — DX: Gastro-esophageal reflux disease without esophagitis: K21.9

## 2024-09-15 HISTORY — DX: Depression, unspecified: F32.A

## 2024-09-15 HISTORY — DX: Cardiac murmur, unspecified: R01.1

## 2024-09-15 HISTORY — DX: Other psychoactive substance abuse, uncomplicated: F19.10

## 2024-09-15 LAB — URINALYSIS, ROUTINE W REFLEX MICROSCOPIC
Bilirubin Urine: NEGATIVE
Glucose, UA: NEGATIVE mg/dL
Hgb urine dipstick: NEGATIVE
Ketones, ur: NEGATIVE mg/dL
Leukocytes,Ua: NEGATIVE
Nitrite: NEGATIVE
Protein, ur: NEGATIVE mg/dL
Specific Gravity, Urine: 1.02 (ref 1.005–1.030)
pH: 6 (ref 5.0–8.0)

## 2024-09-15 LAB — CBC
HCT: 40.3 % (ref 36.0–46.0)
Hemoglobin: 12.4 g/dL (ref 12.0–15.0)
MCH: 25.7 pg — ABNORMAL LOW (ref 26.0–34.0)
MCHC: 30.8 g/dL (ref 30.0–36.0)
MCV: 83.4 fL (ref 80.0–100.0)
Platelets: 388 K/uL (ref 150–400)
RBC: 4.83 MIL/uL (ref 3.87–5.11)
RDW: 14 % (ref 11.5–15.5)
WBC: 9.9 K/uL (ref 4.0–10.5)
nRBC: 0 % (ref 0.0–0.2)

## 2024-09-15 NOTE — H&P (Signed)
 CC/HPI: cc: SUI    1. Nephrolithiasis: Managed with indapamide. Last stone event was over 1 year ago. Doing well on medication   2. New onset stress urinary incontinence: She began taking a new mood stabilizing medication in June and reports that since beginning the medication she is having large leakage with cough, laugh and sneeze. The leakage is even worse with laughter. She states she will soil her underwear and pants. She is also had a few urgency related accidents. She recently put on 50 pounds due to starting the new mood stabilizing medication. She states it is helping with her mental health and it is not a medication she would like to stop. She is interested in medications and/or pelvic floor physical therapy.   05/16/2024: Shelly Porter presents today for follow-up. She has not found any benefit from [facility] or exercises to help with her urinary incontinence. 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 She is seeing her neurosurgeon today to discuss next steps. At this time, she would like to hold off on changing anything until she has more information regarding this tumor and what the next steps of treatment will be.   06/07/2024: 23 year old woman with new onset stress urinary incontinence here for follow-up. She has tried pelvic floor physical therapy which has not helped her. The incontinence is variable but impacting her quality of life. She is nervous that she will leak with laughter when she is out with friends and recently had incontinence on the trampoline park. She had a 55 pound weight gain over the last year and feels like this has significantly impacted her leakage. Urodynamic study was done which did not show any stress incontinence but did show low amplitude overactivity. She had a history of urethral trauma 23 years old that healed without any intervention.   UDS SUMMARY  Shelly Porter held a max capacity of approx. 316 mls. Her 1st sensation was felt at 117 mls. No SUI  noted. There was positive low amplitude instability. She felt an increased urgency but did not leak or void. She was able to generate a voluntary contraction and void 316 ml with max flow of 13 ml/s. Max detrusor pressure while voiding was 35 cmH20. Mild increased EMG activity was noted during voiding. PVR was approx. 0 mls. During cough and Valsalva, the bladder descended approximately 0-1 cm. No trabeculation was noted. No reflux was seen. She will return for UDS follow up.   08/02/2024: 23 year old woman with symptomatic stress urinary continence here for cystoscopy and pelvic exam. Urodynamic study did not show any stress incontinence. Patient feels like this is really affecting her quality of life. She had urethral trauma at age 50 has trauma from this experience.     ALLERGIES: Clindamycin - Hives Doxycycline - Anaphylaxis Iodine Lamictal minocycline Penicillins - Hives sulfa - Hives    MEDICATIONS: Folic Acid  Indapamide  Lexapro  20 MG Tablet  Pantoprazole  Sodium 20 MG Tablet Delayed Release  Rexulti  Xyzal     GU PSH: Complex cystometrogram, w/ void pressure and urethral pressure profile studies, any technique - 05/29/2024 Complex Uroflow - 05/29/2024 Emg surf Electrd - 05/29/2024 ESWL, Right - 2021 Inject For cystogram - 05/29/2024 Intrabd voidng Press - 05/29/2024       PSH Notes: Tube placement in ears-2002   Ear drum repair-2013   NON-GU PSH: Knee Arthroscopy/surgery Shoulder Arthroscopy/surgery Visit Complexity (formerly GPC1X) - 05/16/2024, 01/26/2024     GU PMH: Stress Incontinence - 06/07/2024, -  05/16/2024, - 01/26/2024 Mixed incontinence - 05/29/2024 Flank Pain - 2023, - 2022, - 2021, She has colic with the fragments and got relief with toradol . I discussed continued observation vs ureteroscopy and she will let me know on Weds if she feels she needs URS. I think she will pass the stone. , - 2021, - 2021, - 2021, Her pain has resolved and she thinks she might have passed  a small stone. , - 2021, She has intermittent right low back pain with right renal stones that are stable since August 2020 and there is no hydro on US  today so I can't pin the pain on the stones. I think her pain could be musculoskeletal and I will send a script for meloxicam 7.5mg  daily which she has tolerated before. There is a moderate interaction warning with the lexapro  so I requested that she ask her psychiatrist if there needs to be a dose adjustment on the lexapro . I will have her return in 4-6 weeks. , - 2021 Renal calculus - 2023, She has a stable RLP stone but may have passed a small stone recently. She will need a KUB in a year. , - 2022, - 2021, - 2021 (Stable), - 2021 Microscopic hematuria - 2021, She has microhematuria with bacteriuria. The hematuria could be related to the stones or possibly infection. I will send a culture. , - 2021 Ureteral calculus - 2021 Gross hematuria - 2021, - 2021 Proteinuria, She has 1+ proteinuria which is chronic. - 2021    NON-GU PMH: Hypercalciuria - 2023, She had worsening hypercalciuria with a level of 269 on her litholink in March and her UOP was 1.1 l. I have discussed hydration and will start low dose indapamide for the elevated calcium. BMP in a week and then a litholink with f/u in 3 months. , - 2022 Anxiety Asthma GERD    FAMILY HISTORY: Hematuria - Father Kidney Stones - Runs in Family   SOCIAL HISTORY: Marital Status: Single Preferred Language: English; Ethnicity: Not Hispanic Or Latino; Race: White Current Smoking Status: Patient has never smoked.   Tobacco Use Assessment Completed: Used Tobacco in last 30 days? Does not use smokeless tobacco. Has never drank.  Drinks 3 caffeinated drinks per day. 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    REVIEW OF SYSTEMS:    GU Review Female:   Patient denies frequent urination, hard to postpone urination, burning /pain with urination, get up at night to urinate, leakage of urine, stream  starts and stops, trouble starting your stream, have to strain to urinate, and being pregnant.  Gastrointestinal (Upper):   Patient denies nausea, vomiting, and indigestion/ heartburn.  Gastrointestinal (Lower):   Patient denies diarrhea and constipation.  Constitutional:   Patient denies fever, night sweats, weight loss, and fatigue.  Skin:   Patient denies skin rash/ lesion and itching.  Eyes:   Patient denies blurred vision and double vision.  Ears/ Nose/ Throat:   Patient denies sore throat and sinus problems.  Hematologic/Lymphatic:   Patient denies swollen glands and easy bruising.  Cardiovascular:   Patient denies chest pains and leg swelling.  Respiratory:   Patient denies cough and shortness of breath.  Endocrine:   Patient denies excessive thirst.  Musculoskeletal:   Patient denies back pain and joint pain.  Neurological:   Patient denies headaches and dizziness.  Psychologic:   Patient denies depression and anxiety.   VITAL SIGNS: None   GU PHYSICAL EXAMINATION:    External Genitalia: No hirsutism, no rash,  no scarring, no cyst, no erythematous lesion, no papular lesion, no blanched lesion, no warty lesion. No edema.  Urethral Meatus: Normal size. Normal position. No discharge.  Urethra: No tenderness, no mass, no scarring. No hypermobility. No leakage.  Bladder: Normal to palpation, no tenderness, no mass, normal size.  Vagina: No atrophy, no stenosis. No rectocele. No cystocele. No enterocele.   MULTI-SYSTEM PHYSICAL EXAMINATION:    Constitutional: Well-nourished. No physical deformities. Normally developed. Good grooming.  Neck: Neck symmetrical, not swollen. Normal tracheal position.  Respiratory: No labored breathing, no use of accessory muscles.   Skin: No paleness, no jaundice, no cyanosis. No lesion, no ulcer, no rash.  Neurologic / Psychiatric: Oriented to time, oriented to place, oriented to person. No depression, no anxiety, no agitation.  Eyes: Normal  conjunctivae. Normal eyelids.  Ears, Nose, Mouth, and Throat: Left ear no scars, no lesions, no masses. Right ear no scars, no lesions, no masses. Nose no scars, no lesions, no masses. Normal hearing. Normal lips.  Musculoskeletal: Normal gait and station of head and neck.     Complexity of Data:  Records Review:   Previous Patient Records, POC Tool  Urine Test Review:   Urinalysis   PROCEDURES:         Flexible Cystoscopy - 52000  Risks, benefits, and some of the potential complications of the procedure were discussed at length with the patient including infection, bleeding, voiding discomfort, urinary retention, fever, chills, sepsis, and others. All questions were answered. Informed consent was obtained. Antibiotic prophylaxis was given. Sterile technique and intraurethral analgesia were used.  Meatus:  Normal size. Normal location. Normal condition.  Urethra:  No hypermobility. No leakage.  Ureteral Orifices:  Normal location. Normal size. Normal shape. Effluxed clear urine.  Bladder:  No trabeculation. No tumors. Normal mucosa. No stones.      The lower urinary tract was carefully examined. The procedure was well-tolerated and without complications. Antibiotic instructions were given. Instructions were given to call the office immediately for bloody urine, difficulty urinating, urinary retention, painful or frequent urination, fever, chills, nausea, vomiting or other illness. The patient stated that she understood these instructions and would comply with them.         Urinalysis w/Scope Dipstick Dipstick Cont'd Micro  Color: Yellow Bilirubin: Neg mg/dL WBC/hpf: NS (Not Seen)  Appearance: Slightly Cloudy Ketones: Neg mg/dL RBC/hpf: 0 - 2/hpf  Specific Gravity: 1.025 Blood: Trace ery/uL Bacteria: Few (10-25/hpf)  pH: 6.5 Protein: 1+ mg/dL Cystals: NS (Not Seen)  Glucose: Neg mg/dL Urobilinogen: 1.0 mg/dL Casts: NS (Not Seen)    Nitrites: Neg Trichomonas: Not Present    Leukocyte  Esterase: Neg leu/uL Mucous: Present      Epithelial Cells: 0 - 5/hpf      Yeast: NS (Not Seen)      Sperm: Not Present    ASSESSMENT:      ICD-10 Details  1 GU:   Stress Incontinence - N39.3 Chronic, Stable   PLAN:           Schedule Return Visit/Planned Activity: Next Available Appointment - PT Referral          Document Letter(s):  Created for Patient: Clinical Summary         Notes:   1. Stress urinary incontinence:  - No evidence of stress urinary continence on cystoscopy or urodynamic study  - Would recommend she get pelvic for physical therapy longer than 6 weeks     Update: Underwent Glean UDS. Confirmed SUI. Discussed bulkamid.

## 2024-09-19 ENCOUNTER — Encounter (HOSPITAL_COMMUNITY)
Admission: RE | Disposition: A | Discharge status: Home / Self Care | Payer: Self-pay | Source: Home / Self Care | Attending: Urology

## 2024-09-19 ENCOUNTER — Ambulatory Visit (HOSPITAL_COMMUNITY): Payer: Self-pay | Admitting: Physician Assistant

## 2024-09-19 ENCOUNTER — Ambulatory Visit (HOSPITAL_COMMUNITY): Admitting: Anesthesiology

## 2024-09-19 ENCOUNTER — Ambulatory Visit (HOSPITAL_COMMUNITY)
Admission: RE | Admit: 2024-09-19 | Discharge: 2024-09-19 | Disposition: A | Discharge status: Home / Self Care | Attending: Urology | Admitting: Urology

## 2024-09-19 ENCOUNTER — Encounter (HOSPITAL_COMMUNITY): Payer: Self-pay | Admitting: Urology

## 2024-09-19 DIAGNOSIS — N393 Stress incontinence (female) (male): Secondary | ICD-10-CM | POA: Diagnosis present

## 2024-09-19 DIAGNOSIS — I10 Essential (primary) hypertension: Secondary | ICD-10-CM | POA: Diagnosis not present

## 2024-09-19 HISTORY — PX: CYSTO WITH HYDRODISTENSION: SHX5453

## 2024-09-19 HISTORY — PX: CYSTOSCOPY WITH INJECTION: SHX1424

## 2024-09-19 LAB — POCT PREGNANCY, URINE: Preg Test, Ur: NEGATIVE

## 2024-09-19 SURGERY — CYSTOSCOPY, WITH BLADDER HYDRODISTENSION
Anesthesia: General

## 2024-09-19 MED ORDER — HYDROMORPHONE HCL 1 MG/ML IJ SOLN
INTRAMUSCULAR | Status: AC
  Filled 2024-09-19: qty 1

## 2024-09-19 MED ORDER — DEXAMETHASONE SOD PHOSPHATE PF 10 MG/ML IJ SOLN
INTRAMUSCULAR | Status: DC | PRN
  Administered 2024-09-19: 10 mg via INTRAVENOUS

## 2024-09-19 MED ORDER — FENTANYL CITRATE (PF) 100 MCG/2ML IJ SOLN
INTRAMUSCULAR | Status: AC
  Filled 2024-09-19: qty 2

## 2024-09-19 MED ORDER — MIDAZOLAM HCL (PF) 2 MG/2ML IJ SOLN: 0.5000 mg | Freq: Once | INTRAMUSCULAR | Status: DC | PRN

## 2024-09-19 MED ORDER — MIDAZOLAM HCL 2 MG/2ML IJ SOLN
INTRAMUSCULAR | Status: AC
  Filled 2024-09-19: qty 2

## 2024-09-19 MED ORDER — ONDANSETRON HCL 4 MG/2ML IJ SOLN
INTRAMUSCULAR | Status: AC
  Filled 2024-09-19: qty 2

## 2024-09-19 MED ORDER — OXYCODONE HCL 5 MG PO TABS: 5.0000 mg | ORAL_TABLET | Freq: Once | ORAL | Status: DC | PRN

## 2024-09-19 MED ORDER — SCOPOLAMINE 1 MG/3DAYS TD PT72
1.0000 | MEDICATED_PATCH | TRANSDERMAL | Status: DC
  Administered 2024-09-19: 1 mg via TRANSDERMAL
  Filled 2024-09-19: qty 1

## 2024-09-19 MED ORDER — PROPOFOL 10 MG/ML IV BOLUS
INTRAVENOUS | Status: AC
  Filled 2024-09-19: qty 20

## 2024-09-19 MED ORDER — ORAL CARE MOUTH RINSE: 15.0000 mL | Freq: Once | OROMUCOSAL | Status: AC

## 2024-09-19 MED ORDER — FENTANYL CITRATE (PF) 100 MCG/2ML IJ SOLN
INTRAMUSCULAR | Status: DC | PRN
  Administered 2024-09-19 (×3): 25 ug via INTRAVENOUS

## 2024-09-19 MED ORDER — LIDOCAINE HCL (PF) 2 % IJ SOLN
INTRAMUSCULAR | Status: AC
  Filled 2024-09-19: qty 5

## 2024-09-19 MED ORDER — HYDROMORPHONE HCL 1 MG/ML IJ SOLN
0.2500 mg | INTRAMUSCULAR | Status: DC | PRN
  Administered 2024-09-19 (×4): 0.5 mg via INTRAVENOUS

## 2024-09-19 MED ORDER — OXYCODONE HCL 5 MG/5ML PO SOLN: 5.0000 mg | Freq: Once | ORAL | Status: DC | PRN

## 2024-09-19 MED ORDER — LIDOCAINE HCL (CARDIAC) PF 100 MG/5ML IV SOSY
PREFILLED_SYRINGE | INTRAVENOUS | Status: DC | PRN
  Administered 2024-09-19: 40 mg via INTRAVENOUS

## 2024-09-19 MED ORDER — CHLORHEXIDINE GLUCONATE 0.12 % MT SOLN
15.0000 mL | Freq: Once | OROMUCOSAL | Status: AC
  Administered 2024-09-19: 15 mL via OROMUCOSAL

## 2024-09-19 MED ORDER — WATER FOR IRRIGATION, STERILE IR SOLN
Status: DC | PRN
  Administered 2024-09-19: 3000 mL

## 2024-09-19 MED ORDER — MEPERIDINE HCL 100 MG/ML IJ SOLN: 6.2500 mg | INTRAMUSCULAR | Status: DC | PRN

## 2024-09-19 MED ORDER — ACETAMINOPHEN 500 MG PO TABS
1000.0000 mg | ORAL_TABLET | Freq: Once | ORAL | Status: AC
  Administered 2024-09-19: 1000 mg via ORAL
  Filled 2024-09-19: qty 2

## 2024-09-19 MED ORDER — CEFAZOLIN SODIUM-DEXTROSE 2-4 GM/100ML-% IV SOLN
2.0000 g | Freq: Once | INTRAVENOUS | Status: AC
  Administered 2024-09-19: 2 g via INTRAVENOUS
  Filled 2024-09-19: qty 100

## 2024-09-19 MED ORDER — PROPOFOL 10 MG/ML IV BOLUS
INTRAVENOUS | Status: DC | PRN
  Administered 2024-09-19 (×2): 50 mg via INTRAVENOUS
  Administered 2024-09-19: 200 mg via INTRAVENOUS

## 2024-09-19 MED ORDER — ONDANSETRON HCL 4 MG/2ML IJ SOLN
INTRAMUSCULAR | Status: DC | PRN
  Administered 2024-09-19: 4 mg via INTRAVENOUS

## 2024-09-19 MED ORDER — LACTATED RINGERS IV SOLN: INTRAVENOUS | Status: DC

## 2024-09-19 SURGICAL SUPPLY — 17 items
BAG URO CATCHER STRL LF (MISCELLANEOUS) ×1 IMPLANT
CLOTH BEACON ORANGE TIMEOUT ST (SAFETY) ×1 IMPLANT
ELECT REM PT RETURN 15FT ADLT (MISCELLANEOUS) ×1 IMPLANT
GLOVE BIO SURGEON STRL SZ 6.5 (GLOVE) ×1 IMPLANT
GOWN STRL REUS W/ TWL LRG LVL3 (GOWN DISPOSABLE) ×1 IMPLANT
KIT TURNOVER KIT A (KITS) ×1 IMPLANT
MANIFOLD NEPTUNE II (INSTRUMENTS) ×1 IMPLANT
NDL ASPIRATION 22 (NEEDLE) ×1 IMPLANT
NDL SAFETY ECLIPSE 18X1.5 (NEEDLE) ×1 IMPLANT
NEEDLE ASPIRATION 22 (NEEDLE) ×1 IMPLANT
PACK CYSTO (CUSTOM PROCEDURE TRAY) ×1 IMPLANT
SYR 20ML LL LF (SYRINGE) ×1 IMPLANT
SYR CONTROL 10ML LL (SYRINGE) ×1 IMPLANT
SYSTEM URETHRAL BULK BULKAMID (Female Continence) IMPLANT
TUBING CONNECTING 10 (TUBING) ×1 IMPLANT
TUBING UROLOGY SET (TUBING) ×1 IMPLANT
WATER STERILE IRR 3000ML UROMA (IV SOLUTION) ×1 IMPLANT

## 2024-09-19 NOTE — Op Note (Signed)
 Operative Note   Preoperative diagnosis:  1.  Stress urinary incontinence 2.  Decreased bladder capacity   Postoperative diagnosis: 1.  Stress urinary incontinence 2.  Decreased bladder capacity   Procedure(s): 1.  Cystoscopy with injection of bulkamid 2.  Hydrodistension   Surgeon: Valli Shank, MD   Assistants:  None   Anesthesia:  General   Complications:  None   EBL:  minimal   Specimens: 1. none   Drains/Catheters: 1.  none   Intraoperative findings:   Normal urethra Bilateral orthotopic ureteral orifices Normal bladder mucosa No signs of Hunner's lesions after hydrodistention Bladder capacity 400 mL   Indication:  23 yo woman with symptomatic stress urinary incontinence.   Description of procedure:   After risks and benefits of the procedure discussed with the patient, informed consent was obtained.  The patient was taken to the operating placed in the supine position.  Anesthesia was induced and antibiotics were administered.  The patient was then repositioned in the dorsolithotomy position.  She was prepped and draped in usual sterile fashion a timeout performed with the attending present.  The cystoscope was placed in the space and advanced into the bladder under direct visualization.  Systematic cystoscopy took place and findings noted above.  Patient's initial bladder capacity was just under 400 cc.  The patient's bladder was decompressed and then filled under gravity drainage until it spilled over.  It was held in place for 3 minutes.  It was then drained.    Next, the cystoscope was assembled with the Bulkamid system.  It was then placed in the urethral meatus and advanced into the bladder under direct visualization.  Prior cystoscopy had been done which noted normal anatomic landmarks.  These were again verified during cystoscopy today.  The cystoscope was brought back to the bladder neck and the needle was advanced through the needle guide at the 1  o'clock position.  Once it was visualized and advanced it was rotated to the 5 o'clock position.  Bulkamid was then injected until bleb was seen.  This was then repeated at the 1 o'clock position in the 7 o'clock position until coaptation was noted.   This concluded the case.  The patient's bladder was left with approximately 200 cc of sterile saline.  The patient emerged from anesthesia and was transferred the PACU in stable condition.   Plan:  Plan for patient to void in PACU prior to discharge.

## 2024-09-19 NOTE — Transfer of Care (Signed)
 Immediate Anesthesia Transfer of Care Note  Patient: Shelly Porter  Procedure(s) Performed: CYSTOSCOPY, WITH BLADDER HYDRODISTENSION CYSTOSCOPY, WITH INJECTION OF BLADDER NECK OR BLADDER WALL  Patient Location: PACU  Anesthesia Type:General  Level of Consciousness: drowsy  Airway & Oxygen Therapy: Patient Spontanous Breathing and Patient connected to face mask oxygen  Post-op Assessment: Report given to RN and Post -op Vital signs reviewed and stable  Post vital signs: Reviewed and stable  Last Vitals:  Vitals Value Taken Time  BP 106/79 09/19/24 16:27  Temp    Pulse 96 09/19/24 16:28  Resp 17 09/19/24 16:28  SpO2 100 % 09/19/24 16:28  Vitals shown include unfiled device data.  Last Pain:  Vitals:   09/19/24 1320  TempSrc:   PainSc: 0-No pain         Complications: No notable events documented.

## 2024-09-19 NOTE — Anesthesia Postprocedure Evaluation (Signed)
 Anesthesia Post Note  Patient: Shelly Porter  Procedure(s) Performed: CYSTOSCOPY, WITH BLADDER HYDRODISTENSION CYSTOSCOPY, WITH INJECTION OF BLADDER NECK OR BLADDER WALL     Patient location during evaluation: PACU Anesthesia Type: General Level of consciousness: awake and alert, oriented and patient cooperative Pain control: pain improving, pt worried will sting with urination. Vital Signs Assessment: post-procedure vital signs reviewed and stable Respiratory status: spontaneous breathing, nonlabored ventilation and respiratory function stable Cardiovascular status: blood pressure returned to baseline and stable Postop Assessment: no apparent nausea or vomiting Anesthetic complications: no   No notable events documented.  Last Vitals:  Vitals:   09/19/24 1645 09/19/24 1700  BP: 116/86 121/76  Pulse: 95 (!) 105  Resp: 14 18  Temp:    SpO2: 97% 100%    Last Pain:  Vitals:   09/19/24 1659  TempSrc:   PainSc: 5                  Joeangel Jeanpaul,E. Novalyn Lajara

## 2024-09-19 NOTE — Anesthesia Procedure Notes (Signed)
 Procedure Name: LMA Insertion Date/Time: 09/19/2024 3:53 PM  Performed by: Therisa Doyal CROME, CRNAPatient Re-evaluated:Patient Re-evaluated prior to induction Oxygen Delivery Method: Circle system utilized Preoxygenation: Pre-oxygenation with 100% oxygen Induction Type: IV induction LMA: LMA inserted LMA Size: 4.0 Number of attempts: 1 Placement Confirmation: positive ETCO2 and breath sounds checked- equal and bilateral Tube secured with: Tape Dental Injury: Teeth and Oropharynx as per pre-operative assessment

## 2024-09-19 NOTE — Anesthesia Preprocedure Evaluation (Addendum)
 Anesthesia Evaluation  Patient identified by MRN, date of birth, ID band Patient awake    Reviewed: Allergy & Precautions, NPO status , Patient's Chart, lab work & pertinent test results  History of Anesthesia Complications Negative for: history of anesthetic complications  Airway Mallampati: II  TM Distance: >3 FB Neck ROM: Full    Dental  (+) Dental Advisory Given   Pulmonary former smoker   breath sounds clear to auscultation       Cardiovascular hypertension, Pt. on medications (-) angina  Rhythm:Regular Rate:Normal     Neuro/Psych  Headaches PSYCHIATRIC DISORDERS (ADHD) Anxiety Depression       GI/Hepatic Neg liver ROS,GERD  Medicated and Controlled,,  Endo/Other  BMI 34  Renal/GU negative Renal ROS     Musculoskeletal   Abdominal   Peds  Hematology Hb 12.4, plt 388k   Anesthesia Other Findings   Reproductive/Obstetrics                              Anesthesia Physical Anesthesia Plan  ASA: 2  Anesthesia Plan: General   Post-op Pain Management: Tylenol  PO (pre-op)* and Minimal or no pain anticipated   Induction: Intravenous  PONV Risk Score and Plan: 3 and Ondansetron , Dexamethasone and Scopolamine patch - Pre-op  Airway Management Planned: LMA  Additional Equipment: None  Intra-op Plan:   Post-operative Plan:   Informed Consent: I have reviewed the patients History and Physical, chart, labs and discussed the procedure including the risks, benefits and alternatives for the proposed anesthesia with the patient or authorized representative who has indicated his/her understanding and acceptance.     Dental advisory given  Plan Discussed with: CRNA and Surgeon  Anesthesia Plan Comments:          Anesthesia Quick Evaluation

## 2024-09-19 NOTE — Discharge Instructions (Signed)
Cystoscopy with Bulkamid patient instructions  Following a cystoscopy, a catheter (a flexible rubber tube) is sometimes left in place to empty the bladder. This may cause some discomfort or a feeling that you need to urinate. Your doctor determines the period of time that the catheter will be left in place. You may have bloody urine for two to three days (Call your doctor if the amount of bleeding increases or does not subside).  You may pass blood clots in your urine, especially if you had a biopsy. It is not unusual to pass small blood clots and have some bloody urine a couple of weeks after your cystoscopy. Again, call your doctor if the bleeding does not subside. You may have: Dysuria (painful urination) Frequency (urinating often) Urgency (strong desire to urinate)  These symptoms are common especially if medicine is instilled into the bladder or a ureteral stent is placed. Avoiding alcohol and caffeine, such as coffee, tea, and chocolate, may help relieve these symptoms. Drink plenty of water, unless otherwise instructed. Your doctor may also prescribe an antibiotic or other medicine to reduce these symptoms.  Cystoscopy results are available soon after the procedure; biopsy results usually take two to four days. Your doctor will discuss the results of your exam with you. Before you go home, you will be given specific instructions for follow-up care. Special Instructions:   If you are going home with a catheter in place do not take a tub bath until removed by your doctor.   You may resume your normal activities.   Do not drive or operate machinery if you are taking narcotic pain medicine.   Be sure to keep all follow-up appointments with your doctor.   Call Your Doctor If: The catheter is not draining You have severe pain You are unable to urinate You have a fever over 101 You have severe bleeding

## 2024-09-19 NOTE — Interval H&P Note (Signed)
 History and Physical Interval Note:  09/19/2024 1:30 PM  Shelly Porter  has presented today for surgery, with the diagnosis of FEMALE STRESS INCONTINENCE.  The various methods of treatment have been discussed with the patient and family. After consideration of risks, benefits and other options for treatment, the patient has consented to  Procedure(s) with comments: CYSTOSCOPY, WITH BLADDER HYDRODISTENSION (N/A) - CYSTOSCOPY WITH BLADDER HYDRODISTENTION AND BULKAMID CYSTOSCOPY, WITH INJECTION OF BLADDER NECK OR BLADDER WALL (N/A) as a surgical intervention.  The patient's history has been reviewed, patient examined, no change in status, stable for surgery.  I have reviewed the patient's chart and labs.  Questions were answered to the patient's satisfaction.     Kristalynn Coddington D Delisa Finck

## 2024-09-20 ENCOUNTER — Encounter (HOSPITAL_COMMUNITY): Payer: Self-pay | Admitting: Urology

## 2024-11-12 ENCOUNTER — Ambulatory Visit
Admission: EM | Admit: 2024-11-12 | Discharge: 2024-11-12 | Disposition: A | Discharge status: Home / Self Care | Attending: Family Medicine | Admitting: Family Medicine

## 2024-11-12 DIAGNOSIS — Z113 Encounter for screening for infections with a predominantly sexual mode of transmission: Secondary | ICD-10-CM | POA: Diagnosis present

## 2024-11-12 NOTE — ED Provider Notes (Signed)
 " [facility]    CSN: [id number] Arrival date & time: 11/12/24  9056      History   Chief Complaint Chief Complaint  Patient presents with   vaginal issues    HPI Shelly Porter is a 23 y.o. female presents for STD testing.  Patient did testing with her just in September and was positive for but as she was asymptomatic she was not treated.  She states her girlfriend recently did test positive for BV and was symptomatic and has completed treatment.  Patient is now requesting BV screening and treatment.  She is currently asymptomatic and denies any vaginal discharge, dysuria, fevers.  No STD exposure.  No other concerns at this time  HPI  Past Medical History:  Diagnosis Date   ADHD (attention deficit hyperactivity disorder)    Anemia    Anxiety    Asthma    Complication of anesthesia    emotional wiith Versed  and  my body temp drops.    Depression    Family history of adverse reaction to anesthesia    my mom has a low body temp with anesthesia and she gets nauseous too.    GERD (gastroesophageal reflux disease)    Headache    cluster and migraine   Heart murmur    History of kidney stones    Serum sickness due to drug    Reaction with Doxycycline   Substance abuse [facility])     Patient Active Problem List   Diagnosis Date Noted   MDD (major depressive disorder) 07/31/2019   Major depressive disorder, recurrent episode, moderate (HCC) 04/27/2019   MDD (major depressive disorder), recurrent severe, without psychosis (HCC) 04/04/2019   ADHD, hyperactive-impulsive type 04/04/2019   GAD (generalized anxiety disorder) 04/04/2019   Chronic post-traumatic stress disorder (PTSD) 04/04/2019    Past Surgical History:  Procedure Laterality Date   ADENOIDECTOMY     CYSTO WITH HYDRODISTENSION N/A 09/19/2024   Procedure: CYSTOSCOPY, WITH BLADDER HYDRODISTENSION;  Surgeon: Elisabeth Valli BIRCH, MD;  Location: [facility];  Service: Urology;  Laterality: N/A;  CYSTOSCOPY WITH  BLADDER HYDRODISTENTION AND BULKAMID   CYSTOSCOPY WITH INJECTION N/A 09/19/2024   Procedure: CYSTOSCOPY, WITH INJECTION OF BLADDER NECK OR BLADDER WALL;  Surgeon: Elisabeth Valli BIRCH, MD;  Location: [facility];  Service: Urology;  Laterality: N/A;   EXTRACORPOREAL SHOCK WAVE LITHOTRIPSY Right 08/15/2020   Procedure: RIGHT EXTRACORPOREAL SHOCK WAVE LITHOTRIPSY (ESWL);  Surgeon: Gaston Hamilton, MD;  Location: [facility];  Service: Urology;  Laterality: Right;   EXTRACORPOREAL SHOCK WAVE LITHOTRIPSY  05/2017   KNEE ARTHROSCOPY W/ MENISCAL REPAIR     SHOULDER ARTHROSCOPY     Nov 2019   SHOULDER SURGERY     TONSILLECTOMY AND ADENOIDECTOMY     TYMPANOPLASTY     TYMPANOSTOMY TUBE PLACEMENT      OB History   No obstetric history on file.      Home Medications    Prior to Admission medications  Medication Sig Start Date End Date Taking? Authorizing Provider  acetaminophen  (TYLENOL ) 500 MG tablet Take 500-1,000 mg by mouth every 6 (six) hours as needed (pain).    [provider]  brexpiprazole (REXULTI) 1 MG TABS tablet Take 1 mg by mouth in the morning.    [provider]  escitalopram  (LEXAPRO ) 20 MG tablet Take 20 mg by mouth in the morning.    [provider]  indapamide (LOZOL) 1.25 MG tablet Take 1.25 mg by mouth in the morning.  03/26/21   [provider]  Lactobacillus (AZO VAGINAL HEALTH PROBIOTIC PO) Take 1 capsule by mouth in the morning.    [provider]  levocetirizine (XYZAL) 5 MG tablet Take 5 mg by mouth in the morning.    [provider]  ondansetron  (ZOFRAN  ODT) 4 MG disintegrating tablet Take 1 tablet (4 mg total) by mouth every 8 (eight) hours as needed for nausea or vomiting. 08/16/20   Norris Will PARAS, PA-C  pantoprazole  (PROTONIX ) 20 MG tablet Take 20 mg by mouth daily before breakfast. 02/21/21   [provider]  prazosin (MINIPRESS) 1 MG capsule Take 1 mg by mouth at bedtime as needed (PTSD  nightmares).    [provider]    Family History Family History  Problem Relation Age of Onset   Schizophrenia Paternal Uncle     Social History Social History[1]   Allergies   Doxycycline, Lamotrigine, Shellfish allergy, Betadine [povidone iodine], Clindamycin/lincomycin, Penicillins, Sertraline hcl, and Sulfa antibiotics   Review of Systems Review of Systems  Genitourinary:        STD screening     Physical Exam Triage Vital Signs ED Triage Vitals  Encounter Vitals Group     BP 11/12/24 1112 109/77     Girls Systolic BP Percentile --      Girls Diastolic BP Percentile --      Boys Systolic BP Percentile --      Boys Diastolic BP Percentile --      Pulse Rate 11/12/24 1112 90     Resp 11/12/24 1112 17     Temp 11/12/24 1112 98.1 F (36.7 C)     Temp src --      SpO2 11/12/24 1112 98 %     Weight --      Height --      Head Circumference --      Peak Flow --      Pain Score 11/12/24 1111 0     Pain Loc --      Pain Education --      Exclude from Growth Chart --    No data found.  Updated Vital Signs BP 109/77 (BP Location: Right Arm)   Pulse 90   Temp 98.1 F (36.7 C)   Resp 17   LMP 10/22/2024   SpO2 98%   Visual Acuity Right Eye Distance:   Left Eye Distance:   Bilateral Distance:    Right Eye Near:   Left Eye Near:    Bilateral Near:     Physical Exam Vitals and nursing note reviewed.  Constitutional:      Appearance: Normal appearance.  HENT:     Head: Normocephalic and atraumatic.  Eyes:     Pupils: Pupils are equal, round, and reactive to light.  Cardiovascular:     Rate and Rhythm: Normal rate.  Pulmonary:     Effort: Pulmonary effort is normal.  Skin:    General: Skin is warm and dry.  Neurological:     General: No focal deficit present.     Mental Status: She is alert and oriented to person, place, and time.  Psychiatric:        Mood and Affect: Mood normal.        Behavior: Behavior normal.      UC  Treatments / Results  Labs (all labs ordered are listed, but only abnormal results are displayed) Labs Reviewed  CERVICOVAGINAL ANCILLARY ONLY    EKG   Radiology No results  found.  Procedures Procedures (including critical care time)  Medications Ordered in UC Medications - No data to display  Initial Impression / Assessment and Plan / UC Course  I have reviewed the triage vital signs and the nursing notes.  Pertinent labs & imaging results that were available during my care of the patient were reviewed by me and considered in my medical decision making (see chart for details).     Reviewed exam and symptoms with patient.  Advised that we do not test for asymptomatic BV as it would not indicate treatment.  Patient was agreeable to testing for gonorrhea chlamydia and trichomonas which was obtained via self swab and will contact patient for any positive results.  She can follow-up with gynecology as needed Final Clinical Impressions(s) / UC Diagnoses   Final diagnoses:  Screening examination for STD (sexually transmitted disease)     Discharge Instructions      The clinical contact you for any positive results of the testing done today.  Follow-up with your gynecologist as needed.     ED Prescriptions   None    PDMP not reviewed this encounter.    [1]  Social History Tobacco Use   Smoking status: Former    Types: Cigarettes   Smokeless tobacco: Never  Vaping Use   Vaping status: Every Day  Substance Use Topics   Alcohol use: No    Alcohol/week: 0.0 standard drinks of alcohol   Drug use: No     Loreda Myla SAUNDERS, NP 11/12/24 1137  "

## 2024-11-12 NOTE — Discharge Instructions (Signed)
 The clinical contact you for any positive results of the testing done today.  Follow-up with your gynecologist as needed.

## 2024-11-12 NOTE — ED Triage Notes (Signed)
 Pt present with c/o possible BV. Pt states she does not have symptoms. Pt tested positive for BV 3 months ago, did not receive treatment at GYN. Pt states she did not have symptoms then. Pt states her and her partner shared Sexual devices and passed BV to her partner. Pt states she wants treatment.

## 2024-11-13 LAB — CERVICOVAGINAL ANCILLARY ONLY
Chlamydia: NEGATIVE
Comment: NEGATIVE
Comment: NEGATIVE
Comment: NORMAL
Neisseria Gonorrhea: NEGATIVE
Trichomonas: NEGATIVE
